# Patient Record
Sex: Male | Born: 1955 | Race: White | Hispanic: No | Smoking: Current every day smoker
Health system: Southern US, Community
[De-identification: ages and names within clinical notes are randomized; demographics above are authoritative.]

## PROBLEM LIST (undated history)

## (undated) DIAGNOSIS — M1990 Unspecified osteoarthritis, unspecified site: Secondary | ICD-10-CM

## (undated) DIAGNOSIS — E039 Hypothyroidism, unspecified: Secondary | ICD-10-CM

## (undated) DIAGNOSIS — E079 Disorder of thyroid, unspecified: Secondary | ICD-10-CM

## (undated) DIAGNOSIS — N529 Male erectile dysfunction, unspecified: Secondary | ICD-10-CM

## (undated) HISTORY — DX: Disorder of thyroid, unspecified: E07.9

## (undated) HISTORY — PX: BACK SURGERY: SHX140

## (undated) HISTORY — DX: Male erectile dysfunction, unspecified: N52.9

## (undated) HISTORY — DX: Unspecified osteoarthritis, unspecified site: M19.90

---

## 2004-03-20 ENCOUNTER — Other Ambulatory Visit: Payer: Self-pay

## 2004-03-20 ENCOUNTER — Emergency Department: Payer: Self-pay | Admitting: Emergency Medicine

## 2004-09-24 ENCOUNTER — Ambulatory Visit: Payer: Self-pay | Admitting: Podiatry

## 2005-01-22 ENCOUNTER — Emergency Department: Payer: Self-pay | Admitting: Emergency Medicine

## 2006-06-06 HISTORY — PX: FOOT FRACTURE SURGERY: SHX645

## 2006-07-11 ENCOUNTER — Ambulatory Visit: Payer: Self-pay

## 2006-08-17 ENCOUNTER — Ambulatory Visit (HOSPITAL_COMMUNITY): Admission: RE | Admit: 2006-08-17 | Discharge: 2006-08-18 | Payer: Self-pay | Admitting: Neurosurgery

## 2006-09-06 ENCOUNTER — Encounter: Admission: RE | Admit: 2006-09-06 | Discharge: 2006-09-06 | Payer: Self-pay | Admitting: Neurosurgery

## 2009-06-03 ENCOUNTER — Encounter: Admission: RE | Admit: 2009-06-03 | Discharge: 2009-06-03 | Payer: Self-pay | Admitting: Neurosurgery

## 2010-10-22 NOTE — Op Note (Signed)
[name], [name]                ACCOUNT NO.:  [id number]   MEDICAL RECORD NO.:  [id number]          PATIENT TYPE:  OIB   LOCATION:  3012                         FACILITY:  [facility]   PHYSICIAN:  Reinaldo Meeker, M.D. DATE OF BIRTH:  Nov 10, 1955   DATE OF PROCEDURE:  08/17/2006  DATE OF DISCHARGE:                               OPERATIVE REPORT   PREOPERATIVE DIAGNOSIS:  Herniated disc C5-6, C6-7 with spinal cord  compression.   POSTOPERATIVE DIAGNOSIS:  Herniated disc C5-6, C6-7 with spinal cord  compression.   PROCEDURE:  C5-6, C6-7 anterior cervical diskectomy with bone bank  fusion followed by Accu-Fix anterior cervical plating.   SURGEON:  Reinaldo Meeker, M.D.   ASSESSMENT:  Dr. Marikay Alar.   PROCEDURE IN DETAIL:  After being placed in the supine position in 10  pounds halter traction, the patient's neck was prepped and draped in the  usual sterile fashion.  Localizing fluoroscopy was used prior to  incision to identify the appropriate level.  Transverse incision was  made in the right anterior neck starting at the midline, heading towards  the medial aspect of the sternocleidomastoid muscle.  The platysma  muscle was then incised transversely.  The natural fascial plane between  the strap muscles medially and the sternocleidomastoid laterally was  identified and followed down to the anterior aspect of the cervical  spine.  The longus coli muscles were identified, slit in the midline,  stripped away bilaterally with resector and key elevator.  Self-  retaining retractor was placed for exposure and x-ray showed we  approached the appropriate level.  Using the #15 blade, the disc at C5-6  and C6-7 was incised.  Using pituitary rongeurs and curets approximately  90% of the disc material was removed at both levels.  High speed drill  was used to widen the interspace at both levels.  The microscope was  draped, brought into field.  Using microdissection technique, starting  at C5-6, the remainder of the disc material down to the posterior  longitudinal ligament was removed.  The ligament was then incised  transversely and the cut edge removed with the Kerrison punch.  Thorough  spinal decompression was carried out by removing the herniated disc  material and proximal foraminal decompression was carried out  bilaterally until the proximal nerve roots were easily visualized.  Inspection was carried out for any evidence of residual compression at  this level, none could be identified so attention was then turned to C6-  7.  Once again, the remainder of the disc material down the posterior  longitudinal ligament was removed.  Once again, the ligament was incised  transversely and the cut edges removed.  Once again, thorough  decompression was carried out at the spinal dura and she had a foraminal  decompression particularly on the left the more symptomatic side.   At this time, inspection was carried out at both levels without any  evidence of residual compression identified.  Large amounts of  irrigation were carried out.  Bleeding was controlled with bipolar  coagulation and gel foam.  Measurements were taken  and two 8 mm bone  bank plugs were reconstituted.  After irrigating once more and  confirming hemostasis, the bone was packed without difficulty and  fluoroscopy showed them to be in good position.  An appropriate length  Accu-Fix anterior cervical plate was then chosen.  Under fluoroscopic  guidance, drill holes were placed followed by placing 13 mm screws x 6.  Locking mechanism was secured bilaterally at all 3 levels.  Final  fluoroscopy showed the plates, screws and plugs all to be in good  position.  Bleeding was controlled with  bipolar coagulation.  This was then closed using interrupted Vicryl on  the platysmas, inverted 5-0 PDS on the subcuticular layer and Steri-  strips on the skin.  Dry sterile dressing and soft towel were applied  and the  patient was extubated and taken to the recovery room in stable  condition.           ______________________________  Reinaldo Meeker, M.D.     ROK/MEDQ  D:  08/17/2006  T:  08/17/2006  Job:  [id number]

## 2013-06-06 HISTORY — PX: COLONOSCOPY: SHX174

## 2013-10-31 ENCOUNTER — Ambulatory Visit: Payer: Self-pay

## 2014-02-07 ENCOUNTER — Ambulatory Visit: Payer: Self-pay | Admitting: Gastroenterology

## 2014-02-11 LAB — PATHOLOGY REPORT

## 2015-06-30 ENCOUNTER — Ambulatory Visit (INDEPENDENT_AMBULATORY_CARE_PROVIDER_SITE_OTHER): Payer: Managed Care, Other (non HMO) | Admitting: General Surgery

## 2015-06-30 ENCOUNTER — Encounter: Payer: Self-pay | Admitting: General Surgery

## 2015-06-30 VITALS — BP 114/70 | HR 76 | Resp 14 | Ht 72.0 in | Wt 249.0 lb

## 2015-06-30 DIAGNOSIS — K645 Perianal venous thrombosis: Secondary | ICD-10-CM | POA: Diagnosis not present

## 2015-06-30 HISTORY — PX: HEMORRHOID SURGERY: SHX153

## 2015-06-30 MED ORDER — HYDROCORTISONE ACE-PRAMOXINE 2.5-1 % RE CREA: 1.0000 "application " | TOPICAL_CREAM | Freq: Three times a day (TID) | RECTAL | Status: DC

## 2015-06-30 MED ORDER — HYDROCORTISONE ACE-PRAMOXINE 2.5-1 % RE CREA: 1.0000 "application " | TOPICAL_CREAM | Freq: Two times a day (BID) | RECTAL | Status: DC

## 2015-06-30 NOTE — Progress Notes (Signed)
Patient ID: [name], male   DOB: May 11, 1956, 60 y.o.   MRN: [id number]  Chief Complaint  Patient presents with  . Rectal Problems    HPI [name] is a 60 y.o. male.  Here today for evaluation of possible external hemorrhoids. He states it feels like a "marble" at his rectum. He noticed it about one week ago.  Denies bleeding. Discomfort sitting but no pain. Bowels move daily and are regular. He has had a hemorrhoid back in the 1980's but no surgery was needed. I have reviewed the history of present illness with the patient.  HPI  Past Medical History  Diagnosis Date  . Arthritis   . Thyroid disease     Past Surgical History  Procedure Laterality Date  . Back surgery  1995, 2008  . Foot fracture surgery Right 2008  . Colonoscopy  2015    History reviewed. No pertinent family history.  Social History Social History  Substance Use Topics  . Smoking status: Current Every Day Smoker -- 0.75 packs/day for 25 years    Types: Cigarettes  . Smokeless tobacco: Never Used  . Alcohol Use: 0.0 oz/week    0 Standard drinks or equivalent per week     Comment: occasionally    Allergies  Allergen Reactions  . Daypro [Oxaprozin] Itching    Current Outpatient Prescriptions  Medication Sig Dispense Refill  . levothyroxine (SYNTHROID, LEVOTHROID) 150 MCG tablet TAKE ONE TABLET BY MOUTH ONCE DAILY...TAKE ON AN EMPTY STOMACH WITH A GLASS OF WATER AT LEAST 30-60 MINUTES BEFORE BREAKFAST    . meloxicam (MOBIC) 7.5 MG tablet Take by mouth.    . Multiple Vitamin (MULTI-VITAMINS) TABS Take by mouth.    . hydrocortisone-pramoxine (ANALPRAM HC) 2.5-1 % rectal cream Place 1 application rectally 2 (two) times daily. 30 g 0   No current facility-administered medications for this visit.    Review of Systems Review of Systems  Constitutional: Negative.   Respiratory: Negative.   Cardiovascular: Negative.     Blood pressure 114/70, pulse 76, resp. rate 14, height 6' (1.829 m),  weight 249 lb (112.946 kg).  Physical Exam Physical Exam  Constitutional: He is oriented to person, place, and time. He appears well-developed and well-nourished.  HENT:  Mouth/Throat: Oropharynx is clear and moist.  Eyes: Conjunctivae are normal. No scleral icterus.  Neck: Neck supple.  Cardiovascular: Normal rate, regular rhythm and normal heart sounds.   Pulmonary/Chest: Effort normal and breath sounds normal.  Abdominal: Soft. There is no tenderness. Hernia confirmed negative in the right inguinal area and confirmed negative in the left inguinal area.  Genitourinary: Rectal exam shows external hemorrhoid.  3-4cm mildly tender, thrombosed external hemorrhoid at 8 o'clock   Lymphadenopathy:    He has no cervical adenopathy.  Neurological: He is alert and oriented to person, place, and time.  Skin: Skin is warm and dry.  Psychiatric: His behavior is normal.    Data Reviewed   Assessment    Thrombosed external hemorrhoid    Plan   With consent the hemorrhoid was lanced and clots removed.  Area was prepped with betadine. 3ml of 1%xylocaine mixed with 0.5%  Instilled. Radial incision made and large amount of clots enucleated. Swelling significantly decreased after. Procedure well tolerated.        PCP/Ref:  Virk, Charanjit  This information has been scribed by Dorathy Daft RNBC.   Hansika Leaming G 06/30/2015, 6:18 PM

## 2015-06-30 NOTE — Patient Instructions (Addendum)
The patient is aware to call back for any questions or concerns.   Patient to return in 3 weeks.

## 2015-07-01 ENCOUNTER — Telehealth: Payer: Self-pay | Admitting: *Deleted

## 2015-07-01 ENCOUNTER — Encounter: Payer: Self-pay | Admitting: General Surgery

## 2015-07-01 NOTE — Telephone Encounter (Signed)
Insurance denied the RX for Analpram. Dr Evette Cristal wants the patient to make use of Anusol HC cream BID (over the counter).

## 2015-07-02 NOTE — Telephone Encounter (Signed)
He did get the Analpram cream for $68. Aware of the OTC product. The patient is aware to call back for any questions or concerns.

## 2015-07-22 ENCOUNTER — Encounter: Payer: Self-pay | Admitting: General Surgery

## 2015-07-22 ENCOUNTER — Ambulatory Visit (INDEPENDENT_AMBULATORY_CARE_PROVIDER_SITE_OTHER): Payer: Managed Care, Other (non HMO) | Admitting: General Surgery

## 2015-07-22 ENCOUNTER — Ambulatory Visit: Payer: Managed Care, Other (non HMO) | Admitting: General Surgery

## 2015-07-22 VITALS — BP 158/80 | HR 70 | Resp 14 | Ht 72.0 in | Wt 245.0 lb

## 2015-07-22 DIAGNOSIS — K645 Perianal venous thrombosis: Secondary | ICD-10-CM

## 2015-07-22 NOTE — Patient Instructions (Signed)
The patient is aware to call back for any questions or concerns.  

## 2015-07-22 NOTE — Progress Notes (Signed)
Here today for follow up incision thrombosed hemorrhoid. He states the area is much better, no pain and no bleeding. Bowels moving regular. I have reviewed the history of present illness with the patient.  The incised hemorrhoid has fully resolved. No new findings.   Follow up as needed. The patient is aware to call back for any questions or concerns.    PCP:  Sula Rumple This information has been scribed by Dorathy Daft RNBC.

## 2016-05-20 ENCOUNTER — Ambulatory Visit (INDEPENDENT_AMBULATORY_CARE_PROVIDER_SITE_OTHER): Payer: 59 | Admitting: Urology

## 2016-05-20 ENCOUNTER — Encounter: Payer: Self-pay | Admitting: Urology

## 2016-05-20 VITALS — BP 142/72 | HR 66 | Ht 72.0 in | Wt 260.0 lb

## 2016-05-20 DIAGNOSIS — N138 Other obstructive and reflux uropathy: Secondary | ICD-10-CM | POA: Diagnosis not present

## 2016-05-20 DIAGNOSIS — N401 Enlarged prostate with lower urinary tract symptoms: Secondary | ICD-10-CM

## 2016-05-20 DIAGNOSIS — N529 Male erectile dysfunction, unspecified: Secondary | ICD-10-CM

## 2016-05-20 MED ORDER — SILDENAFIL CITRATE 20 MG PO TABS: ORAL_TABLET | ORAL | 3 refills | Status: DC

## 2016-05-20 NOTE — Progress Notes (Signed)
05/20/2016 10:33 AM   [name] 1955/10/23 [id number]  Referring provider: Sula Rumpleharanjit Virk, MD [address] [place], [place] [address]  Chief Complaint  Patient presents with  . New Patient (Initial Visit)    ED  referred by Dr. Elmer RampVirk    HPI: Patient is a 60 year old occasion male who was referred by Dr. Elmer RampVIrk for erectile dysfunction.  Erectile dysfunction His SHIM score is 17, which is mild ED.   He has been having difficulty with erections for the last two months.   His major complaint is maintaining an erection.  His libido is preserved.   His risk factors for ED are age, BPH, hypogonadism, DM, HTN, hypothyroidism, stress, night shift work, alcohol abuse and smoking, .  He is not longer having any spontaneous erections.  He denies any painful erections or curvatures with his erections.   He has tried Levitra 10 mg in the past and he did not achieve a very firm erection.         SHIM    Row Name 05/20/16 [id number]         SHIM: Over the last 6 months:   How do you rate your confidence that you could get and keep an erection? Low     When you had erections with sexual stimulation, how often were your erections hard enough for penetration (entering your partner)? Sometimes (about half the time)     During sexual intercourse, how often were you able to maintain your erection after you had penetrated (entered) your partner? Slightly Difficult     During sexual intercourse, how difficult was it to maintain your erection to completion of intercourse? Slightly Difficult     When you attempted sexual intercourse, how often was it satisfactory for you? Slightly Difficult       SHIM Total Score   SHIM 17        Score: 1-7 Severe ED 8-11 Moderate ED 12-16 Mild-Moderate ED 17-21 Mild ED 22-25 No ED    BPH WITH LUTS His IPSS score today is 1, which is mild lower urinary tract symptomatology. He is pleased with his quality life due to his urinary symptoms.   His major  complaint today is nocturia x 1.  He has had these symptoms for several years.  He denies any dysuria, hematuria or suprapubic pain.  He also denies any recent fevers, chills, nausea or vomiting.  He does not have a family history of PCa.     IPSS    Row Name 05/20/16 0900         International Prostate Symptom Score   How often have you had the sensation of not emptying your bladder? Not at All     How often have you had to urinate less than every two hours? Not at All     How often have you found you stopped and started again several times when you urinated? Not at All     How often have you found it difficult to postpone urination? Not at All     How often have you had a weak urinary stream? Not at All     How often have you had to strain to start urination? Not at All     How many times did you typically get up at night to urinate? 1 Time     Total IPSS Score 1       Quality of Life due to urinary symptoms   If you  were to spend the rest of your life with your urinary condition just the way it is now how would you feel about that? Pleased        Score:  1-7 Mild 8-19 Moderate 20-35 Severe    PMH: Past Medical History:  Diagnosis Date  . Arthritis   . ED (erectile dysfunction)   . Thyroid disease     Surgical History: Past Surgical History:  Procedure Laterality Date  . BACK SURGERY  1995, 2008  . COLONOSCOPY  2015  . FOOT FRACTURE SURGERY Right 2008  . HEMORRHOID SURGERY  06-30-15   incision thrombosed hemorrhoid Dr Evette Cristal    Home Medications:  Allergies as of 05/20/2016      Reactions   Daypro [oxaprozin] Itching      Medication List       Accurate as of 05/20/16 10:33 AM. Always use your most recent med list.          hydrocortisone-pramoxine 2.5-1 % rectal cream Commonly known as:  ANALPRAM HC Place 1 application rectally 2 (two) times daily.   levothyroxine 150 MCG tablet Commonly known as:  SYNTHROID, LEVOTHROID TAKE ONE TABLET BY MOUTH ONCE  DAILY...TAKE ON AN EMPTY STOMACH WITH A GLASS OF WATER AT LEAST 30-60 MINUTES BEFORE BREAKFAST   meloxicam 7.5 MG tablet Commonly known as:  MOBIC Take by mouth.   MULTI-VITAMINS Tabs Take by mouth.   sildenafil 20 MG tablet Commonly known as:  REVATIO Take 5 tablets two hours prior to intercourse on empty stomach   vardenafil 10 MG tablet Commonly known as:  LEVITRA Take 10 mg by mouth daily as needed for erectile dysfunction.       Allergies:  Allergies  Allergen Reactions  . Daypro [Oxaprozin] Itching    Family History: Family History  Problem Relation Age of Onset  . Prostate cancer Neg Hx   . Kidney cancer Neg Hx   . Bladder Cancer Neg Hx     Social History:  reports that he has been smoking Cigarettes.  He has a 18.75 pack-year smoking history. He has never used smokeless tobacco. He reports that he drinks alcohol. He reports that he does not use drugs.  ROS: UROLOGY Frequent Urination?: No Hard to postpone urination?: No Burning/pain with urination?: No Get up at night to urinate?: No Leakage of urine?: No Urine stream starts and stops?: No Trouble starting stream?: No Do you have to strain to urinate?: No Blood in urine?: No Urinary tract infection?: No Sexually transmitted disease?: No Injury to kidneys or bladder?: No Painful intercourse?: No Weak stream?: No Erection problems?: Yes Penile pain?: No  Gastrointestinal Nausea?: No Vomiting?: No Indigestion/heartburn?: No Diarrhea?: No Constipation?: No  Constitutional Fever: No Night sweats?: No Weight loss?: No Fatigue?: No  Skin Skin rash/lesions?: No Itching?: No  Eyes Blurred vision?: No Double vision?: No  Ears/Nose/Throat Sore throat?: No Sinus problems?: No  Hematologic/Lymphatic Swollen glands?: No Easy bruising?: No  Cardiovascular Leg swelling?: No Chest pain?: No  Respiratory Cough?: No Shortness of breath?: No  Endocrine Excessive thirst?:  No  Musculoskeletal Back pain?: No Joint pain?: No  Neurological Headaches?: No Dizziness?: No  Psychologic Depression?: No Anxiety?: No  Physical Exam: BP (!) 142/72   Pulse 66   Ht 6' (1.829 m)   Wt 260 lb (117.9 kg)   BMI 35.26 kg/m   Constitutional: Well nourished. Alert and oriented, No acute distress. HEENT: [place] AT, moist mucus membranes. Trachea midline, no masses. Cardiovascular: No clubbing, cyanosis,  or edema. Respiratory: Normal respiratory effort, no increased work of breathing. GI: Abdomen is soft, non tender, non distended, no abdominal masses. Liver and spleen not palpable.  No hernias appreciated.  Stool sample for occult testing is not indicated.   GU: No CVA tenderness.  No bladder fullness or masses.  Patient with uncircumcised phallus.  Foreskin easily retracted  Urethral meatus is patent.  No penile discharge. No penile lesions or rashes. Scrotum without lesions, cysts, rashes and/or edema.  Testicles are located scrotally bilaterally. No masses are appreciated in the testicles. Left and right epididymis are normal. Rectal: Patient with  normal sphincter tone. Anus and perineum without scarring or rashes. No rectal masses are appreciated. Prostate is approximately 50 grams, no nodules are appreciated. Seminal vesicles are normal. Skin: No rashes, bruises or suspicious lesions. Lymph: No cervical or inguinal adenopathy. Neurologic: Grossly intact, no focal deficits, moving all 4 extremities. Psychiatric: Normal mood and affect.  Laboratory Data: PSA History  0.83 ng/mL on 12/12/2014  1.02 ng/mL on 04/13/2016  Assessment & Plan:    1. Erectile dysfunction  - SHIM score is 17  - I explained to the patient that in order to achieve an erection it takes good functioning of the nervous system (parasympathetic, sympathetic, sensory and motor), good blood flow into the erectile tissue of the penis and a desire to have sex  - I explained that conditions like  diabetes, hypertension, coronary artery disease, peripheral vascular disease, smoking, alcohol consumption, age, sleep apnea and BPH can diminish the ability to have an erection  - We discussed trying a  different PDE5 inhibitor, intra-urethral suppositories, intracavernous vasoactive drug injection therapy, vacuum constriction device and penile prosthesis implantation  - He would like to try sildenafil 20 mg, take 5 tablets two hours prior to intercourse on an empty stomach  - RTC in one month for repeat SHIM score and exam   2. BPH with LUTS  - IPSS score is 1/1  - Continue conservative management, avoiding bladder irritants and timed voiding's  - RTC in 12 months for IPSS, PSA and exam   Return in about 1 month (around 06/20/2016) for SHIM score.  These notes generated with voice recognition software. I apologize for typographical errors.  Michiel CowboySHANNON Serine Kea, PA-C  [facility] [address], [address] [place], [place] [address] [phone]

## 2016-06-24 ENCOUNTER — Ambulatory Visit: Payer: 59 | Admitting: Urology

## 2016-07-15 ENCOUNTER — Encounter: Payer: Self-pay | Admitting: Urology

## 2016-07-15 ENCOUNTER — Ambulatory Visit (INDEPENDENT_AMBULATORY_CARE_PROVIDER_SITE_OTHER): Payer: 59 | Admitting: Urology

## 2016-07-15 VITALS — BP 137/69 | HR 73 | Ht 72.0 in | Wt 264.0 lb

## 2016-07-15 DIAGNOSIS — N529 Male erectile dysfunction, unspecified: Secondary | ICD-10-CM | POA: Diagnosis not present

## 2016-07-15 NOTE — Progress Notes (Signed)
07/15/2016 12:02 PM   [name] 1956/01/19 [id number]  Referring provider: Sula Rumple, MD [address] [facility] [address] [place], [place] [address]  Chief Complaint  Patient presents with  . Erectile Dysfunction    1 month follow up    HPI: Patient is a 61 year old Caucasian male who presents today for a one month follow up for ED after a trial of sildenafil 20 mg.    Erectile dysfunction Patient was referred by Dr. Elmer Ramp for ED.  His SHIM score is 16, which is mild to moderate ED.   His previous SHIM score was 17.  He has been having difficulty with erections for the last two months.   His major complaint is maintaining an erection.  His libido is preserved.   His risk factors for ED are age, BPH, hypogonadism, DM, HTN, hypothyroidism, stress, night shift work, alcohol abuse and smoking, .  He is not longer having any spontaneous erections.  He denies any painful erections or curvatures with his erections.   He has tried Levitra 10 mg in the past and he did not achieve a very firm erection.   More recently, he has tried sildenafil 20 mg.  He states he had satisfactory erections with the medication, but he would like to have erections naturally.      SHIM    Row Name 05/20/16 [phone] 07/15/16 0859       SHIM: Over the last 6 months:   How do you rate your confidence that you could get and keep an erection? Low Low    When you had erections with sexual stimulation, how often were your erections hard enough for penetration (entering your partner)? Sometimes (about half the time) Sometimes (about half the time)    During sexual intercourse, how often were you able to maintain your erection after you had penetrated (entered) your partner? Slightly Difficult Difficult    During sexual intercourse, how difficult was it to maintain your erection to completion of intercourse? Slightly Difficult Slightly Difficult    When you attempted sexual intercourse, how often was it satisfactory for you?  Slightly Difficult Slightly Difficult      SHIM Total Score   SHIM 17 16       Score: 1-7 Severe ED 8-11 Moderate ED 12-16 Mild-Moderate ED 17-21 Mild ED 22-25 No ED  PMH: Past Medical History:  Diagnosis Date  . Arthritis   . ED (erectile dysfunction)   . Thyroid disease     Surgical History: Past Surgical History:  Procedure Laterality Date  . BACK SURGERY  1995, 2008  . COLONOSCOPY  2015  . FOOT FRACTURE SURGERY Right 2008  . HEMORRHOID SURGERY  06-30-15   incision thrombosed hemorrhoid Dr Evette Cristal    Home Medications:  Allergies as of 07/15/2016      Reactions   Daypro [oxaprozin] Itching      Medication List       Accurate as of 07/15/16 12:02 PM. Always use your most recent med list.          hydrocortisone-pramoxine 2.5-1 % rectal cream Commonly known as:  ANALPRAM HC Place 1 application rectally 2 (two) times daily.   levothyroxine 150 MCG tablet Commonly known as:  SYNTHROID, LEVOTHROID TAKE ONE TABLET BY MOUTH ONCE DAILY...TAKE ON AN EMPTY STOMACH WITH A GLASS OF WATER AT LEAST 30-60 MINUTES BEFORE BREAKFAST   meloxicam 7.5 MG tablet Commonly known as:  MOBIC Take by mouth.   MULTI-VITAMINS Tabs Take by mouth.  sildenafil 20 MG tablet Commonly known as:  REVATIO Take 5 tablets two hours prior to intercourse on empty stomach   vardenafil 10 MG tablet Commonly known as:  LEVITRA Take 10 mg by mouth daily as needed for erectile dysfunction.       Allergies:  Allergies  Allergen Reactions  . Daypro [Oxaprozin] Itching    Family History: Family History  Problem Relation Age of Onset  . Prostate cancer Neg Hx   . Kidney cancer Neg Hx   . Bladder Cancer Neg Hx     Social History:  reports that he has been smoking Cigarettes.  He has a 18.75 pack-year smoking history. He has never used smokeless tobacco. He reports that he drinks alcohol. He reports that he does not use drugs.  ROS: UROLOGY Frequent Urination?: No Hard to postpone  urination?: Yes Burning/pain with urination?: No Get up at night to urinate?: No Leakage of urine?: No Urine stream starts and stops?: No Trouble starting stream?: No Do you have to strain to urinate?: No Blood in urine?: No Urinary tract infection?: No Sexually transmitted disease?: No Injury to kidneys or bladder?: No Painful intercourse?: No Weak stream?: No Erection problems?: Yes Penile pain?: No  Gastrointestinal Nausea?: No Vomiting?: No Indigestion/heartburn?: No Diarrhea?: No Constipation?: No  Constitutional Fever: No Night sweats?: No Weight loss?: No Fatigue?: No  Skin Skin rash/lesions?: No Itching?: No  Eyes Blurred vision?: No Double vision?: No  Ears/Nose/Throat Sore throat?: No Sinus problems?: No  Hematologic/Lymphatic Swollen glands?: No Easy bruising?: No  Cardiovascular Leg swelling?: No Chest pain?: No  Respiratory Cough?: No Shortness of breath?: No  Endocrine Excessive thirst?: No  Musculoskeletal Back pain?: No Joint pain?: Yes  Neurological Headaches?: No Dizziness?: No  Psychologic Depression?: No Anxiety?: No  Physical Exam: BP 137/69   Pulse 73   Ht 6' (1.829 m)   Wt 264 lb (119.7 kg)   BMI 35.80 kg/m   Constitutional: Well nourished. Alert and oriented, No acute distress. HEENT:  AT, moist mucus membranes. Trachea midline, no masses. Cardiovascular: No clubbing, cyanosis, or edema. Respiratory: Normal respiratory effort, no increased work of breathing. Skin: No rashes, bruises or suspicious lesions. Lymph: No cervical or inguinal adenopathy. Neurologic: Grossly intact, no focal deficits, moving all 4 extremities. Psychiatric: Normal mood and affect.  Laboratory Data: PSA History  0.83 ng/mL on 12/12/2014  1.02 ng/mL on 04/13/2016  Assessment & Plan:    1. Erectile dysfunction  - SHIM score is 16  - patient is very upset that he cannot have erections naturally, I have a very frank discussion  with the patient regarding how his age, BPH, weight, smoking and prediabetes status are contributing to his ED.  I encouraged the patient to stop smoking, lose weight, engage in healthy eating and exercises to help with his ED.  I did express that I did not feel that he would be able to achieve satisfactory erections at this stage without the use of   - He was very upset that I did not check his urine at today's visit as he felt it could give a reason as to why he is having erectile dysfunction, when I queried him further, he stated he read on the Internet that cancer can cause ED.  I reviewed his lab work and his exam findings and stated that there was no indication to obtain an urine or any signs of cancer  - He was also upset at my suggestions to stop smoking and to  lose weight as he stated he had been smoking for a long time and he actually weighs less now than he did when he was younger and he did not have trouble having erections, at this time I offered the patient an appointment with another provider as he was not satisfied with my care - he stated he would make his own appointment with another urologist   2. BPH with LUTS  - Not addressed at this visit    Return for patient will make his own appointment with another urologist.  These notes generated with voice recognition software. I apologize for typographical errors.  Michiel Cowboy, PA-C  [facility] [address] [place], [place] [address] [phone]

## 2017-12-27 ENCOUNTER — Encounter

## 2017-12-27 ENCOUNTER — Ambulatory Visit: Payer: 59 | Admitting: Urology

## 2017-12-27 ENCOUNTER — Encounter: Payer: Self-pay | Admitting: Urology

## 2017-12-27 VITALS — BP 113/68 | HR 59 | Ht 72.0 in | Wt 246.0 lb

## 2017-12-27 DIAGNOSIS — N529 Male erectile dysfunction, unspecified: Secondary | ICD-10-CM

## 2017-12-27 DIAGNOSIS — N401 Enlarged prostate with lower urinary tract symptoms: Secondary | ICD-10-CM | POA: Diagnosis not present

## 2017-12-27 LAB — URINALYSIS, COMPLETE
BILIRUBIN UA: NEGATIVE
GLUCOSE, UA: NEGATIVE
KETONES UA: NEGATIVE
LEUKOCYTES UA: NEGATIVE
Nitrite, UA: NEGATIVE
PROTEIN UA: NEGATIVE
RBC, UA: NEGATIVE
SPEC GRAV UA: 1.015 (ref 1.005–1.030)
Urobilinogen, Ur: 0.2 mg/dL (ref 0.2–1.0)
pH, UA: 6 (ref 5.0–7.5)

## 2017-12-27 MED ORDER — SILDENAFIL CITRATE 20 MG PO TABS: ORAL_TABLET | ORAL | 1 refills | Status: DC

## 2017-12-27 NOTE — Progress Notes (Signed)
12/27/2017 10:34 AM   [name] 25-Mar-1956 [id number]  Referring provider: Sula Rumple, MD No address on file  Chief Complaint  Patient presents with  . Erectile Dysfunction    HPI: 62 year old male most recently followed by Dr. Achilles Dunk for erectile dysfunction and BPH.  He saw Carollee Herter in our office in 2018 and elected to be seen at [facility].  He last saw Dr. Achilles Dunk in June 2018.  He remains on tamsulosin with stable lower urinary tract symptoms.  He does note urinary frequency and urgency.  Denies dysuria or gross hematuria.  On chart review his last PSA was in 2017 and was 1.02.  He is currently using generic sildenafil 20 mg and taking 100 mg for ED.  He has taken Cialis in the past which he felt was more effective however is cost prohibitive.   PMH: Past Medical History:  Diagnosis Date  . Arthritis   . ED (erectile dysfunction)   . Thyroid disease     Surgical History: Past Surgical History:  Procedure Laterality Date  . BACK SURGERY  1995, 2008  . COLONOSCOPY  2015  . FOOT FRACTURE SURGERY Right 2008  . HEMORRHOID SURGERY  06-30-15   incision thrombosed hemorrhoid Dr Evette Cristal    Home Medications:  Allergies as of 12/27/2017      Reactions   Daypro [oxaprozin] Itching      Medication List        Accurate as of 12/27/17 10:34 AM. Always use your most recent med list.          ibuprofen 200 MG tablet Commonly known as:  ADVIL,MOTRIN Take by mouth.   ketoconazole 2 % cream Commonly known as:  NIZORAL Apply topically.   levothyroxine 175 MCG tablet Commonly known as:  SYNTHROID, LEVOTHROID 150 mcg.   MULTI-VITAMINS Tabs Take by mouth.   sildenafil 20 MG tablet Commonly known as:  REVATIO Take 5 tablets two hours prior to intercourse on empty stomach   tamsulosin 0.4 MG Caps capsule Commonly known as:  FLOMAX Take 0.4 mg by mouth daily.   vardenafil 10 MG tablet Commonly known as:  LEVITRA Take 10 mg by mouth daily as needed for erectile  dysfunction.       Allergies:  Allergies  Allergen Reactions  . Daypro [Oxaprozin] Itching    Family History: Family History  Problem Relation Age of Onset  . Prostate cancer Neg Hx   . Kidney cancer Neg Hx   . Bladder Cancer Neg Hx     Social History:  reports that he has been smoking cigarettes.  He has a 18.75 pack-year smoking history. He has never used smokeless tobacco. He reports that he drinks alcohol. He reports that he does not use drugs.  ROS: UROLOGY Frequent Urination?: Yes Hard to postpone urination?: Yes Burning/pain with urination?: No Get up at night to urinate?: Yes Leakage of urine?: No Urine stream starts and stops?: No Trouble starting stream?: No Do you have to strain to urinate?: No Blood in urine?: No Urinary tract infection?: No Sexually transmitted disease?: No Injury to kidneys or bladder?: No Painful intercourse?: No Weak stream?: No Erection problems?: Yes Penile pain?: No  Gastrointestinal Nausea?: No Vomiting?: No Indigestion/heartburn?: No Diarrhea?: No Constipation?: No  Constitutional Fever: No Night sweats?: No Weight loss?: No Fatigue?: No  Skin Skin rash/lesions?: No Itching?: No  Eyes Blurred vision?: No Double vision?: No  Ears/Nose/Throat Sore throat?: No Sinus problems?: No  Hematologic/Lymphatic Swollen glands?: No Easy bruising?: No  Cardiovascular Leg swelling?: No Chest pain?: No  Respiratory Cough?: No Shortness of breath?: No  Endocrine Excessive thirst?: No  Musculoskeletal Back pain?: No Joint pain?: Yes  Neurological Headaches?: No Dizziness?: No  Psychologic Depression?: No Anxiety?: No  Physical Exam: BP 113/68 (BP Location: Left Arm, Patient Position: Sitting, Cuff Size: Large)   Pulse (!) 59   Ht 6' (1.829 m)   Wt 246 lb (111.6 kg)   BMI 33.36 kg/m   Constitutional:  Alert and oriented, No acute distress. HEENT: [place] AT, moist mucus membranes.  Trachea midline, no  masses. Cardiovascular: No clubbing, cyanosis, or edema. Respiratory: Normal respiratory effort, no increased work of breathing. GI: Abdomen is soft, nontender, nondistended, no abdominal masses GU: No CVA tenderness.  Prostate 40 g, smooth without nodules. Lymph: No cervical or inguinal lymphadenopathy. Skin: No rashes, bruises or suspicious lesions. Neurologic: Grossly intact, no focal deficits, moving all 4 extremities. Psychiatric: Normal mood and affect.   Assessment & Plan:   1.  BPH with lower urinary tract symptoms  He inquired about the etiology of his lower urinary tract symptoms and was informed the most common cause is prostate enlargement.  His DRE is benign.  A PSA was ordered and he will be notified with the results.  Continue tamsulosin.  2.  Erectile dysfunction  He also inquired about the etiology of his erectile dysfunction.  His most significant risk factor is chronic tobacco use and was informed this is a common etiology of arterial insufficiency causing ED.  He was informed the best test to document this would be a penile Doppler study however he desired not to pursue.   Riki AltesScott C Michaeljames Milnes, MD  [facility] [address], [address] [place], [place] [address] [phone]

## 2017-12-28 LAB — PSA: PROSTATE SPECIFIC AG, SERUM: 0.7 ng/mL (ref 0.0–4.0)

## 2018-01-01 ENCOUNTER — Telehealth: Payer: Self-pay

## 2018-01-01 NOTE — Telephone Encounter (Signed)
-----   Message from Riki AltesScott C Stoioff, MD sent at 12/31/2017  8:57 AM EDT ----- PSA normal at 0.7

## 2018-01-01 NOTE — Telephone Encounter (Signed)
Tried to call pt with results, line ringing busy.

## 2018-01-02 NOTE — Telephone Encounter (Signed)
Tried calling pt, line ringing busy.

## 2018-01-03 NOTE — Telephone Encounter (Signed)
Tried calling pt, no answer, line busy.

## 2018-05-29 ENCOUNTER — Other Ambulatory Visit: Payer: Self-pay | Admitting: Unknown Physician Specialty

## 2018-05-29 DIAGNOSIS — M25361 Other instability, right knee: Secondary | ICD-10-CM

## 2018-05-29 DIAGNOSIS — G8929 Other chronic pain: Secondary | ICD-10-CM

## 2018-05-29 DIAGNOSIS — M25561 Pain in right knee: Secondary | ICD-10-CM

## 2018-06-04 ENCOUNTER — Other Ambulatory Visit: Payer: Self-pay | Admitting: Unknown Physician Specialty

## 2018-06-04 DIAGNOSIS — M25561 Pain in right knee: Secondary | ICD-10-CM

## 2018-06-04 DIAGNOSIS — G8929 Other chronic pain: Secondary | ICD-10-CM

## 2018-06-04 DIAGNOSIS — M25361 Other instability, right knee: Secondary | ICD-10-CM

## 2018-06-08 ENCOUNTER — Ambulatory Visit: Admission: RE | Admit: 2018-06-08 | Payer: 59 | Source: Ambulatory Visit

## 2019-01-02 ENCOUNTER — Ambulatory Visit: Payer: 59 | Admitting: Urology

## 2019-02-05 ENCOUNTER — Encounter: Payer: Self-pay | Admitting: Urology

## 2019-02-05 ENCOUNTER — Other Ambulatory Visit: Payer: Self-pay

## 2019-02-05 ENCOUNTER — Ambulatory Visit: Payer: 59 | Admitting: Urology

## 2019-02-05 VITALS — BP 155/78 | HR 74 | Ht 72.0 in | Wt 260.0 lb

## 2019-02-05 DIAGNOSIS — N5201 Erectile dysfunction due to arterial insufficiency: Secondary | ICD-10-CM

## 2019-02-05 DIAGNOSIS — N401 Enlarged prostate with lower urinary tract symptoms: Secondary | ICD-10-CM

## 2019-02-05 MED ORDER — TADALAFIL 20 MG PO TABS: 20.0000 mg | ORAL_TABLET | Freq: Every day | ORAL | 3 refills | Status: DC | PRN

## 2019-02-05 NOTE — Progress Notes (Signed)
02/05/2019 12:48 PM   John FruitsRonnie D Bucio 03/08/1956 [id number]  Referring provider: [facility], [facility] [address] [place],  [place] [id number]  Chief Complaint  Patient presents with  . Erectile Dysfunction    Urologic history: 1.  BPH with lower urinary tract symptoms  -Tamsulosin 0.4 mg  2.  Erectile dysfunction  -Generic sildenafil 100 mg   HPI: 63 y.o. male presents for annual follow up.  Since his visit last year he has noted worsening storage related symptoms including urinary urgency, frequency and nocturia x2.  He voids with a good stream.  He remains on tamsulosin 0.4 mg daily.  He states his voiding symptoms are bothersome enough that he would desire further medical management.  Denies dysuria or gross hematuria.  He has no flank, abdominal, pelvic or scrotal pain.  IPSS completed today was 18/5.  He remains on tamsulosin 100 mg.  He is inquiring about other options. SHIM completed today was 12-16 indicating mild to moderate ED.  He has been on tadalafil in the past with good results.  PSA performed by his PCP June 2020 was stable at 0.73.   PMH: Past Medical History:  Diagnosis Date  . Arthritis   . ED (erectile dysfunction)   . Thyroid disease     Surgical History: Past Surgical History:  Procedure Laterality Date  . BACK SURGERY  1995, 2008  . COLONOSCOPY  2015  . FOOT FRACTURE SURGERY Right 2008  . HEMORRHOID SURGERY  06-30-15   incision thrombosed hemorrhoid Dr Evette CristalSankar    Home Medications:  Allergies as of 02/05/2019      Reactions   Daypro [oxaprozin] Itching      Medication List       Accurate as of February 05, 2019 12:48 PM. If you have any questions, ask your nurse or doctor.        STOP taking these medications   sildenafil 20 MG tablet Commonly known as: REVATIO Stopped by: Riki AltesScott C Moriya Mitchell, MD   vardenafil 10 MG tablet Commonly known as: LEVITRA Stopped by: Riki AltesScott C Syna Gad, MD     TAKE these medications    ibuprofen 200 MG tablet Commonly known as: ADVIL Take by mouth.   ketoconazole 2 % cream Commonly known as: NIZORAL Apply topically.   levothyroxine 175 MCG tablet Commonly known as: SYNTHROID 150 mcg.   Multi-Vitamins Tabs Take by mouth.   tadalafil 20 MG tablet Commonly known as: CIALIS Take 1 tablet (20 mg total) by mouth daily as needed for erectile dysfunction. Started by: Riki AltesScott C Revis Whalin, MD   tamsulosin 0.4 MG Caps capsule Commonly known as: FLOMAX Take 0.4 mg by mouth daily.       Allergies:  Allergies  Allergen Reactions  . Daypro [Oxaprozin] Itching    Family History: Family History  Problem Relation Age of Onset  . Prostate cancer Neg Hx   . Kidney cancer Neg Hx   . Bladder Cancer Neg Hx     Social History:  reports that he has been smoking cigarettes. He has a 18.75 pack-year smoking history. He has never used smokeless tobacco. He reports current alcohol use. He reports that he does not use drugs.  ROS: UROLOGY Frequent Urination?: Yes Hard to postpone urination?: Yes Burning/pain with urination?: No Get up at night to urinate?: Yes Leakage of urine?: No Urine stream starts and stops?: No Trouble starting stream?: No Do you have to strain to urinate?: No Blood in urine?: No Urinary tract infection?: No Sexually  transmitted disease?: No Injury to kidneys or bladder?: No Painful intercourse?: No Weak stream?: No Erection problems?: Yes Penile pain?: No  Gastrointestinal Nausea?: No Vomiting?: No Indigestion/heartburn?: No Diarrhea?: No Constipation?: No  Constitutional Fever: No Night sweats?: No Weight loss?: No Fatigue?: No  Skin Skin rash/lesions?: No Itching?: No  Eyes Blurred vision?: No Double vision?: No  Ears/Nose/Throat Sore throat?: No Sinus problems?: No  Hematologic/Lymphatic Swollen glands?: No Easy bruising?: No  Cardiovascular Leg swelling?: No Chest pain?: No  Respiratory Cough?: No Shortness  of breath?: No  Endocrine Excessive thirst?: No  Musculoskeletal Back pain?: No Joint pain?: No  Neurological Headaches?: No Dizziness?: No  Psychologic Depression?: No Anxiety?: No  Physical Exam: BP (!) 155/78   Pulse 74   Ht 6' (1.829 m)   Wt 260 lb (117.9 kg)   BMI 35.26 kg/m   Constitutional:  Alert and oriented, No acute distress. HEENT: [place] AT, moist mucus membranes.  Trachea midline, no masses. Cardiovascular: No clubbing, cyanosis, or edema. Respiratory: Normal respiratory effort, no increased work of breathing. GI: Abdomen is soft, nontender, nondistended, no abdominal masses GU: No CVA tenderness.  Prostate 40 g, smooth without nodules Lymph: No cervical or inguinal lymphadenopathy. Skin: No rashes, bruises or suspicious lesions. Neurologic: Grossly intact, no focal deficits, moving all 4 extremities. Psychiatric: Normal mood and affect.   Assessment & Plan:    - BPH with lower urinary tract symptoms Increase in storage related voiding symptoms.  Trial Myrbetriq 50 mg daily.  Samples given.  He will call back regarding efficacy.  - Erectile dysfunction Trial tadalafil 20 mg 1 hour prior to intercourse.  Return in about 1 year (around 02/05/2020) for Recheck.   Abbie Sons, [place] [address] [place], [place] [address] [phone]

## 2019-10-11 ENCOUNTER — Other Ambulatory Visit: Payer: Self-pay | Admitting: Orthopedic Surgery

## 2019-10-11 DIAGNOSIS — Z87828 Personal history of other (healed) physical injury and trauma: Secondary | ICD-10-CM

## 2019-10-11 DIAGNOSIS — G8929 Other chronic pain: Secondary | ICD-10-CM

## 2019-10-11 DIAGNOSIS — M25361 Other instability, right knee: Secondary | ICD-10-CM

## 2019-10-11 DIAGNOSIS — M25561 Pain in right knee: Secondary | ICD-10-CM

## 2019-10-11 DIAGNOSIS — M2351 Chronic instability of knee, right knee: Secondary | ICD-10-CM

## 2019-11-09 ENCOUNTER — Ambulatory Visit
Admission: RE | Admit: 2019-11-09 | Discharge: 2019-11-09 | Disposition: A | Discharge status: Home / Self Care | Payer: 59 | Source: Ambulatory Visit | Attending: Orthopedic Surgery | Admitting: Orthopedic Surgery

## 2019-11-09 DIAGNOSIS — Z87828 Personal history of other (healed) physical injury and trauma: Secondary | ICD-10-CM

## 2019-11-09 DIAGNOSIS — M2351 Chronic instability of knee, right knee: Secondary | ICD-10-CM

## 2019-11-09 DIAGNOSIS — G8929 Other chronic pain: Secondary | ICD-10-CM

## 2019-11-09 DIAGNOSIS — M25361 Other instability, right knee: Secondary | ICD-10-CM

## 2019-11-09 DIAGNOSIS — M25561 Pain in right knee: Secondary | ICD-10-CM

## 2020-02-07 ENCOUNTER — Ambulatory Visit: Payer: 59 | Admitting: Urology

## 2020-02-12 ENCOUNTER — Encounter: Payer: Self-pay | Admitting: Urology

## 2020-02-12 ENCOUNTER — Other Ambulatory Visit: Payer: Self-pay

## 2020-02-12 ENCOUNTER — Ambulatory Visit: Payer: 59 | Admitting: Urology

## 2020-02-12 VITALS — BP 137/71 | HR 71 | Ht 71.0 in | Wt 270.0 lb

## 2020-02-12 DIAGNOSIS — N401 Enlarged prostate with lower urinary tract symptoms: Secondary | ICD-10-CM | POA: Diagnosis not present

## 2020-02-12 DIAGNOSIS — N5201 Erectile dysfunction due to arterial insufficiency: Secondary | ICD-10-CM

## 2020-02-12 LAB — BLADDER SCAN AMB NON-IMAGING: Scan Result: 0

## 2020-02-12 NOTE — Progress Notes (Signed)
02/12/2020 10:23 AM   [name] Mar 12, 1956 [id number]  Referring provider: Medicine, [facility] [address] [place],  [place] [address]  Chief Complaint  Patient presents with  . Benign Prostatic Hypertrophy    Urologic history: 1.  BPH with lower urinary tract symptoms             -Tamsulosin 0.4 mg  2.  Erectile dysfunction             -Generic sildenafil 100 mg  HPI: 64 y.o. male presents for annual follow-up.   Ran out of tamsulosin 2 months ago and did not call for refill since appointment visit was scheduled  No worsening of his voiding symptoms since stopping tamsulosin  Symptoms have not significantly changed since last year with most bothersome symptoms urinary frequency and urgency  IPSS completed today 14/35  Denies dysuria, gross hematuria  Denies flank, abdominal or pelvic pain  PSA performed at PCP office 11/2019 stable at 0.68  Remains on sildenafil for ED   PMH: Past Medical History:  Diagnosis Date  . Arthritis   . ED (erectile dysfunction)   . Thyroid disease     Surgical History: Past Surgical History:  Procedure Laterality Date  . BACK SURGERY  1995, 2008  . COLONOSCOPY  2015  . FOOT FRACTURE SURGERY Right 2008  . HEMORRHOID SURGERY  06-30-15   incision thrombosed hemorrhoid Dr Evette Cristal    Home Medications:  Allergies as of 02/12/2020      Reactions   Daypro [oxaprozin] Itching      Medication List       Accurate as of February 12, 2020 10:23 AM. If you have any questions, ask your nurse or doctor.        ibuprofen 200 MG tablet Commonly known as: ADVIL Take by mouth.   ketoconazole 2 % cream Commonly known as: NIZORAL Apply topically.   levothyroxine 175 MCG tablet Commonly known as: SYNTHROID 150 mcg.   Multi-Vitamins Tabs Take by mouth.   tadalafil 20 MG tablet Commonly known as: CIALIS Take 1 tablet (20 mg total) by mouth daily as needed for erectile dysfunction.   tamsulosin 0.4 MG  Caps capsule Commonly known as: FLOMAX Take 0.4 mg by mouth daily.       Allergies:  Allergies  Allergen Reactions  . Daypro [Oxaprozin] Itching    Family History: Family History  Problem Relation Age of Onset  . Prostate cancer Neg Hx   . Kidney cancer Neg Hx   . Bladder Cancer Neg Hx     Social History:  reports that he has been smoking cigarettes. He has a 18.75 pack-year smoking history. He has never used smokeless tobacco. He reports current alcohol use. He reports that he does not use drugs.   Physical Exam: BP 137/71   Pulse 71   Ht 5\' 11"  (1.803 m)   Wt 270 lb (122.5 kg)   BMI 37.66 kg/m   Constitutional:  Alert and oriented, No acute distress. HEENT: [place] AT, moist mucus membranes.  Trachea midline, no masses. Cardiovascular: No clubbing, cyanosis, or edema. Respiratory: Normal respiratory effort, no increased work of breathing. GI: Abdomen is soft, nontender, nondistended, no abdominal masses GU: Prostate 35 g, smooth without nodules Skin: No rashes, bruises or suspicious lesions. Neurologic: Grossly intact, no focal deficits, moving all 4 extremities. Psychiatric: Normal mood and affect.   Assessment & Plan:    1. Benign prostatic hyperplasia with LUTS   Off tamsulosin x2 months without  worsening of symptoms  PVR by bladder scan 0 mL  Most bothersome symptoms are storage related (frequency/urgency)  Trial Gemtasa 75 mg daily, samples given  Call back 1 month regarding medication efficacy  2.  Erectile dysfunction  Stable on sildenafil   Riki Altes, MD  [facility] [address] [place], [place] [address] [phone]

## 2020-03-06 ENCOUNTER — Other Ambulatory Visit: Payer: Self-pay | Admitting: *Deleted

## 2020-03-06 MED ORDER — VIBEGRON 75 MG PO TABS: 75.0000 mg | ORAL_TABLET | Freq: Every day | ORAL | 6 refills | Status: DC

## 2020-03-10 ENCOUNTER — Other Ambulatory Visit: Payer: Self-pay | Admitting: Family Medicine

## 2020-03-10 MED ORDER — GEMTESA 75 MG PO TABS: 75.0000 mg | ORAL_TABLET | Freq: Every day | ORAL | 6 refills | Status: DC

## 2020-09-30 ENCOUNTER — Telehealth: Payer: Self-pay | Admitting: Family Medicine

## 2020-09-30 NOTE — Telephone Encounter (Signed)
LMOM for patient to return call. A PA was faxed for Leslye Peer but it was prescribed in October last year. Need to be sure he is taking this medication and if he has been paying out of pocket.

## 2020-10-02 ENCOUNTER — Other Ambulatory Visit: Payer: Self-pay | Admitting: Family Medicine

## 2020-10-02 MED ORDER — TAMSULOSIN HCL 0.4 MG PO CAPS: 0.4000 mg | ORAL_CAPSULE | Freq: Every day | ORAL | 1 refills | Status: DC

## 2020-10-02 NOTE — Telephone Encounter (Signed)
Okay to change back to tamsulosin

## 2020-10-02 NOTE — Telephone Encounter (Signed)
Spoke to patient and he states he has been taking Gemtesa for several months but he feels like the Tamsulosin works better. Also the Tamsulosin is more cost effective. Can we change the medication?

## 2020-10-02 NOTE — Telephone Encounter (Signed)
Left message on voicemail.

## 2021-02-11 ENCOUNTER — Other Ambulatory Visit: Payer: Self-pay

## 2021-02-11 ENCOUNTER — Encounter: Payer: Self-pay | Admitting: Urology

## 2021-02-11 ENCOUNTER — Ambulatory Visit (INDEPENDENT_AMBULATORY_CARE_PROVIDER_SITE_OTHER): Payer: 59 | Admitting: Urology

## 2021-02-11 VITALS — BP 140/70 | HR 82 | Ht 71.0 in | Wt 262.0 lb

## 2021-02-11 DIAGNOSIS — N5201 Erectile dysfunction due to arterial insufficiency: Secondary | ICD-10-CM

## 2021-02-11 DIAGNOSIS — Z125 Encounter for screening for malignant neoplasm of prostate: Secondary | ICD-10-CM

## 2021-02-11 DIAGNOSIS — N401 Enlarged prostate with lower urinary tract symptoms: Secondary | ICD-10-CM

## 2021-02-11 LAB — URINALYSIS, COMPLETE
Bilirubin, UA: NEGATIVE
Glucose, UA: NEGATIVE
Ketones, UA: NEGATIVE
Leukocytes,UA: NEGATIVE
Nitrite, UA: NEGATIVE
Protein,UA: NEGATIVE
RBC, UA: NEGATIVE
Specific Gravity, UA: 1.025 (ref 1.005–1.030)
Urobilinogen, Ur: 1 mg/dL (ref 0.2–1.0)
pH, UA: 5.5 (ref 5.0–7.5)

## 2021-02-11 LAB — MICROSCOPIC EXAMINATION
Bacteria, UA: NONE SEEN
RBC, Urine: NONE SEEN /hpf (ref 0–2)

## 2021-02-11 LAB — BLADDER SCAN AMB NON-IMAGING: SCA Result: 42

## 2021-02-11 MED ORDER — TADALAFIL 20 MG PO TABS: ORAL_TABLET | ORAL | 5 refills | Status: DC

## 2021-02-11 NOTE — Progress Notes (Signed)
02/11/2021 9:11 AM   [name] Feb 17, 1956 [id number]  Referring provider: [facility] [address] [place],  [place] [address]  Chief Complaint  Patient presents with   Benign Prostatic Hypertrophy    Urologic history: 1.  BPH with lower urinary tract symptoms Tamsulosin 0.4 mg   2.  Erectile dysfunction Generic tadalafil 20 mg  HPI: 65 y.o. male presents for annual follow-up.  At last years visit he had stopped his tamsulosin and was complaining of urinary frequency and urgency; IPSS 14/35 Since the symptoms had not worsened off tamsulosin he was given a trial of Gemtesa that in retrospect felt like the tamsulosin was more effective and started back on the tamsulosin April 2022 Denies dysuria, gross hematuria No flank, abdominal or pelvic pain PSA 11/26/2020 stable at 0.68 Tadalafil remains effective for ED   PMH: Past Medical History:  Diagnosis Date   Arthritis    ED (erectile dysfunction)    Thyroid disease     Surgical History: Past Surgical History:  Procedure Laterality Date   BACK SURGERY  1995, 2008   COLONOSCOPY  2015   FOOT FRACTURE SURGERY Right 2008   HEMORRHOID SURGERY  06-30-15   incision thrombosed hemorrhoid Dr Evette Cristal    Home Medications:  Allergies as of 02/11/2021       Reactions   Daypro [oxaprozin] Itching        Medication List        Accurate as of February 11, 2021  9:11 AM. If you have any questions, ask your nurse or doctor.          STOP taking these medications    Gemtesa 75 MG Tabs Generic drug: Vibegron Stopped by: Riki Altes, MD       TAKE these medications    ibuprofen 200 MG tablet Commonly known as: ADVIL Take by mouth.   ketoconazole 2 % cream Commonly known as: NIZORAL Apply topically.   levothyroxine 175 MCG tablet Commonly known as: SYNTHROID 150 mcg.   Multi-Vitamins Tabs Take by mouth.   tadalafil 20 MG tablet Commonly known as: CIALIS Take 1 tablet (20 mg  total) by mouth daily as needed for erectile dysfunction. What changed: Another medication with the same name was added. Make sure you understand how and when to take each. Changed by: Riki Altes, MD   tadalafil 20 MG tablet Commonly known as: CIALIS Take 1 tab one hour prior to intercourse What changed: You were already taking a medication with the same name, and this prescription was added. Make sure you understand how and when to take each. Changed by: Riki Altes, MD   tamsulosin 0.4 MG Caps capsule Commonly known as: FLOMAX Take 1 capsule (0.4 mg total) by mouth daily.        Allergies:  Allergies  Allergen Reactions   Daypro [Oxaprozin] Itching    Family History: Family History  Problem Relation Age of Onset   Prostate cancer Neg Hx    Kidney cancer Neg Hx    Bladder Cancer Neg Hx     Social History:  reports that he has been smoking cigarettes. He has a 18.75 pack-year smoking history. He has never used smokeless tobacco. He reports current alcohol use. He reports that he does not use drugs.   Physical Exam: BP 140/70   Pulse 82   Ht 5\' 11"  (1.803 m)   Wt 262 lb (118.8 kg)   BMI 36.54 kg/m   Constitutional:  Alert and oriented, No acute distress. HEENT: [place] AT, moist mucus membranes.  Trachea midline, no masses. Cardiovascular: No clubbing, cyanosis, or edema. Respiratory: Normal respiratory effort, no increased work of breathing. GU: Prostate 40 g, smooth without nodules Skin: No rashes, bruises or suspicious lesions. Neurologic: Grossly intact, no focal deficits, moving all 4 extremities. Psychiatric: Normal mood and affect.   Assessment & Plan:    BPH with LUTS Stable on tamsulosin Bladder scan PVR 42 mL  2.  Erectile dysfunction Stable Tadalafil refilled  3.  Prostate cancer screening Benign DRE Low and stable PSA  Continue annual follow-up   Riki Altes, MD  [facility] [address] [place], [place] [address] [phone]

## 2021-03-31 ENCOUNTER — Other Ambulatory Visit: Payer: Self-pay | Admitting: Urology

## 2021-06-28 ENCOUNTER — Other Ambulatory Visit: Payer: Self-pay | Admitting: Urology

## 2021-11-09 ENCOUNTER — Other Ambulatory Visit: Payer: Self-pay | Admitting: Orthopedic Surgery

## 2021-11-09 DIAGNOSIS — M23006 Cystic meniscus, unspecified meniscus, right knee: Secondary | ICD-10-CM

## 2021-11-15 ENCOUNTER — Ambulatory Visit
Admission: RE | Admit: 2021-11-15 | Discharge: 2021-11-15 | Disposition: A | Discharge status: Home / Self Care | Payer: 59 | Source: Ambulatory Visit | Attending: Orthopedic Surgery | Admitting: Orthopedic Surgery

## 2021-11-15 DIAGNOSIS — M23006 Cystic meniscus, unspecified meniscus, right knee: Secondary | ICD-10-CM

## 2021-11-22 ENCOUNTER — Other Ambulatory Visit: Payer: Self-pay | Admitting: Orthopedic Surgery

## 2021-11-25 ENCOUNTER — Encounter: Payer: Self-pay | Admitting: Orthopedic Surgery

## 2021-12-03 ENCOUNTER — Encounter: Payer: Self-pay | Admitting: Orthopedic Surgery

## 2021-12-03 ENCOUNTER — Ambulatory Visit: Payer: 59 | Admitting: Anesthesiology

## 2021-12-03 ENCOUNTER — Ambulatory Visit
Admission: RE | Admit: 2021-12-03 | Discharge: 2021-12-03 | Disposition: A | Discharge status: Home / Self Care | Payer: 59 | Attending: Orthopedic Surgery | Admitting: Orthopedic Surgery

## 2021-12-03 ENCOUNTER — Ambulatory Visit: Payer: 59

## 2021-12-03 ENCOUNTER — Other Ambulatory Visit: Payer: Self-pay

## 2021-12-03 ENCOUNTER — Encounter
Admission: RE | Disposition: A | Discharge status: Home / Self Care | Payer: Self-pay | Source: Home / Self Care | Attending: Orthopedic Surgery

## 2021-12-03 DIAGNOSIS — M1990 Unspecified osteoarthritis, unspecified site: Secondary | ICD-10-CM | POA: Insufficient documentation

## 2021-12-03 DIAGNOSIS — Z6837 Body mass index (BMI) 37.0-37.9, adult: Secondary | ICD-10-CM | POA: Diagnosis not present

## 2021-12-03 DIAGNOSIS — N400 Enlarged prostate without lower urinary tract symptoms: Secondary | ICD-10-CM | POA: Diagnosis not present

## 2021-12-03 DIAGNOSIS — S72421A Displaced fracture of lateral condyle of right femur, initial encounter for closed fracture: Secondary | ICD-10-CM | POA: Insufficient documentation

## 2021-12-03 DIAGNOSIS — M25861 Other specified joint disorders, right knee: Secondary | ICD-10-CM | POA: Diagnosis not present

## 2021-12-03 DIAGNOSIS — X58XXXA Exposure to other specified factors, initial encounter: Secondary | ICD-10-CM | POA: Diagnosis not present

## 2021-12-03 DIAGNOSIS — S82141A Displaced bicondylar fracture of right tibia, initial encounter for closed fracture: Secondary | ICD-10-CM | POA: Insufficient documentation

## 2021-12-03 DIAGNOSIS — E039 Hypothyroidism, unspecified: Secondary | ICD-10-CM | POA: Insufficient documentation

## 2021-12-03 DIAGNOSIS — S83281A Other tear of lateral meniscus, current injury, right knee, initial encounter: Secondary | ICD-10-CM | POA: Insufficient documentation

## 2021-12-03 DIAGNOSIS — F17200 Nicotine dependence, unspecified, uncomplicated: Secondary | ICD-10-CM | POA: Diagnosis not present

## 2021-12-03 HISTORY — DX: Hypothyroidism, unspecified: E03.9

## 2021-12-03 HISTORY — PX: KNEE ARTHROSCOPY: SHX127

## 2021-12-03 SURGERY — ARTHROSCOPY, KNEE
Anesthesia: General | Site: Knee | Laterality: Right

## 2021-12-03 MED ORDER — LACTATED RINGERS IR SOLN
Status: DC | PRN
  Administered 2021-12-03: 6000 mL

## 2021-12-03 MED ORDER — LACTATED RINGERS IV SOLN: INTRAVENOUS | Status: DC

## 2021-12-03 MED ORDER — LIDOCAINE HCL (CARDIAC) PF 100 MG/5ML IV SOSY
PREFILLED_SYRINGE | INTRAVENOUS | Status: DC | PRN
  Administered 2021-12-03: 40 mg via INTRATRACHEAL

## 2021-12-03 MED ORDER — GLYCOPYRROLATE 0.2 MG/ML IJ SOLN
INTRAMUSCULAR | Status: DC | PRN
  Administered 2021-12-03: .1 mg via INTRAVENOUS

## 2021-12-03 MED ORDER — HYDROCODONE-ACETAMINOPHEN 5-325 MG PO TABS: 1.0000 | ORAL_TABLET | ORAL | 0 refills | Status: DC | PRN

## 2021-12-03 MED ORDER — ASPIRIN 325 MG PO TBEC: 325.0000 mg | DELAYED_RELEASE_TABLET | Freq: Every day | ORAL | 0 refills | Status: AC

## 2021-12-03 MED ORDER — OXYCODONE HCL 5 MG PO TABS
5.0000 mg | ORAL_TABLET | Freq: Once | ORAL | Status: AC | PRN
  Administered 2021-12-03: 5 mg via ORAL

## 2021-12-03 MED ORDER — LIDOCAINE-EPINEPHRINE 1 %-1:100000 IJ SOLN
INTRAMUSCULAR | Status: DC | PRN
  Administered 2021-12-03: 20 mL via INTRAMUSCULAR

## 2021-12-03 MED ORDER — ONDANSETRON 4 MG PO TBDP: 4.0000 mg | ORAL_TABLET | Freq: Three times a day (TID) | ORAL | 0 refills | Status: DC | PRN

## 2021-12-03 MED ORDER — DEXAMETHASONE SODIUM PHOSPHATE 4 MG/ML IJ SOLN
INTRAMUSCULAR | Status: DC | PRN
  Administered 2021-12-03: 4 mg via INTRAVENOUS

## 2021-12-03 MED ORDER — ACETAMINOPHEN 500 MG PO TABS: 1000.0000 mg | ORAL_TABLET | Freq: Three times a day (TID) | ORAL | 2 refills | Status: AC

## 2021-12-03 MED ORDER — MIDAZOLAM HCL 5 MG/5ML IJ SOLN
INTRAMUSCULAR | Status: DC | PRN
  Administered 2021-12-03: 2 mg via INTRAVENOUS

## 2021-12-03 MED ORDER — CEFAZOLIN SODIUM-DEXTROSE 2-4 GM/100ML-% IV SOLN
2.0000 g | INTRAVENOUS | Status: AC
  Administered 2021-12-03: 2 g via INTRAVENOUS

## 2021-12-03 MED ORDER — EPHEDRINE SULFATE (PRESSORS) 50 MG/ML IJ SOLN
INTRAMUSCULAR | Status: DC | PRN
  Administered 2021-12-03: 5 mg via INTRAVENOUS
  Administered 2021-12-03: 10 mg via INTRAVENOUS

## 2021-12-03 MED ORDER — IBUPROFEN 800 MG PO TABS: 800.0000 mg | ORAL_TABLET | Freq: Three times a day (TID) | ORAL | 0 refills | Status: AC

## 2021-12-03 MED ORDER — FENTANYL CITRATE (PF) 100 MCG/2ML IJ SOLN
INTRAMUSCULAR | Status: DC | PRN
Start: 2021-12-03 — End: 2021-12-03
  Administered 2021-12-03: 25 ug via INTRAVENOUS
  Administered 2021-12-03: 50 ug via INTRAVENOUS
  Administered 2021-12-03 (×3): 25 ug via INTRAVENOUS

## 2021-12-03 MED ORDER — ONDANSETRON HCL 4 MG/2ML IJ SOLN
INTRAMUSCULAR | Status: DC | PRN
  Administered 2021-12-03: 4 mg via INTRAVENOUS

## 2021-12-03 MED ORDER — PROPOFOL 10 MG/ML IV BOLUS
INTRAVENOUS | Status: DC | PRN
  Administered 2021-12-03 (×2): 30 mg via INTRAVENOUS
  Administered 2021-12-03: 140 mg via INTRAVENOUS
  Administered 2021-12-03: 30 mg via INTRAVENOUS

## 2021-12-03 MED ORDER — OXYCODONE HCL 5 MG/5ML PO SOLN: 5.0000 mg | Freq: Once | ORAL | Status: AC | PRN

## 2021-12-03 SURGICAL SUPPLY — 48 items
ADAPTER IRRIG TUBE 2 SPIKE SOL (ADAPTER) ×4 IMPLANT
ADPR TBG 2 SPK PMP STRL ASCP (ADAPTER) ×2
APL PRP STRL LF DISP 70% ISPRP (MISCELLANEOUS) ×1
BLADE SHAVER 4.5X7 STR FR (MISCELLANEOUS) ×2 IMPLANT
BLADE SURG SZ11 CARB STEEL (BLADE) ×3 IMPLANT
BNDG ADH 5X4 AIR PERM ELC (GAUZE/BANDAGES/DRESSINGS) ×1
BNDG COHESIVE 4X5 WHT NS (GAUZE/BANDAGES/DRESSINGS) ×2 IMPLANT
BNDG ESMARK 6X12 TAN STRL LF (GAUZE/BANDAGES/DRESSINGS) ×2 IMPLANT
CANNULA ACCUPORT 11G 120M END (CANNULA) ×1 IMPLANT
CHLORAPREP W/TINT 26 (MISCELLANEOUS) ×2 IMPLANT
COOLER POLAR GLACIER W/PUMP (MISCELLANEOUS) ×2 IMPLANT
COVER LIGHT HANDLE UNIVERSAL (MISCELLANEOUS) ×4 IMPLANT
DRAPE EXTREMITY T 121X128X90 (DISPOSABLE) ×2 IMPLANT
DRAPE IMP U-DRAPE 54X76 (DRAPES) ×2 IMPLANT
GAUZE SPONGE 4X4 12PLY STRL (GAUZE/BANDAGES/DRESSINGS) ×2 IMPLANT
GLOVE SURG ENC MOIS LTX SZ7.5 (GLOVE) ×2 IMPLANT
GLOVE SURG UNDER LTX SZ8 (GLOVE) ×2 IMPLANT
GOWN STRL REIN 2XL XLG LVL4 (GOWN DISPOSABLE) ×2 IMPLANT
GOWN STRL REUS W/TWL LRG LVL3 (GOWN DISPOSABLE) ×2 IMPLANT
GRAFT FILLER BONE 5ML (Knees) IMPLANT
IV LACTATED RINGER IRRG 3000ML (IV SOLUTION) ×8
IV LR IRRIG 3000ML ARTHROMATIC (IV SOLUTION) ×4 IMPLANT
KIT ACCUFILL 5CC (Knees) ×1 IMPLANT
KIT KNEE SCP 414.502 (Knees) ×2 IMPLANT
KIT TURNOVER KIT A (KITS) ×2 IMPLANT
MANIFOLD NEPTUNE II (INSTRUMENTS) ×2 IMPLANT
MAT ABSORB  FLUID 56X50 GRAY (MISCELLANEOUS) ×4
MAT ABSORB FLUID 56X50 GRAY (MISCELLANEOUS) ×1 IMPLANT
NDL SAFETY ECLIPSE 18X1.5 (NEEDLE) ×1 IMPLANT
NEEDLE HYPO 18GX1.5 SHARP (NEEDLE) ×2
PACK ARTHROSCOPY KNEE (MISCELLANEOUS) ×2 IMPLANT
PAD ABD DERMACEA PRESS 5X9 (GAUZE/BANDAGES/DRESSINGS) ×2 IMPLANT
PAD WRAPON POLAR KNEE (MISCELLANEOUS) ×1 IMPLANT
PADDING CAST BLEND 6X4 STRL (MISCELLANEOUS) ×1 IMPLANT
PADDING STRL CAST 6IN (MISCELLANEOUS) ×1
SUT ETHILON 3-0 FS-10 30 BLK (SUTURE) ×2
SUT MNCRL 4-0 (SUTURE) ×2
SUT MNCRL 4-0 27XMFL (SUTURE) ×1
SUT VIC AB 0 CT1 36 (SUTURE) ×1 IMPLANT
SUT VIC AB 2-0 CT2 27 (SUTURE) ×1 IMPLANT
SUTURE EHLN 3-0 FS-10 30 BLK (SUTURE) ×1 IMPLANT
SUTURE MNCRL 4-0 27XMF (SUTURE) IMPLANT
SYR 5ML LL (SYRINGE) ×2 IMPLANT
TOWEL OR 17X26 4PK STRL BLUE (TOWEL DISPOSABLE) ×4 IMPLANT
TUBING INFLOW SET DBFLO PUMP (TUBING) ×2 IMPLANT
TUBING OUTFLOW SET DBLFO PUMP (TUBING) ×2 IMPLANT
WAND WEREWOLF FLOW 90D (MISCELLANEOUS) ×2 IMPLANT
WRAPON POLAR PAD KNEE (MISCELLANEOUS) ×2

## 2021-12-03 NOTE — Anesthesia Procedure Notes (Signed)
Procedure Name: LMA Insertion Date/Time: 12/03/2021 11:49 AM  Performed by: Jimmy Picket, CRNAPre-anesthesia Checklist: Patient identified, Emergency Drugs available, Suction available, Timeout performed and Patient being monitored Patient Re-evaluated:Patient Re-evaluated prior to induction Oxygen Delivery Method: Circle system utilized Preoxygenation: Pre-oxygenation with 100% oxygen Induction Type: IV induction LMA: LMA inserted LMA Size: 5.0 Number of attempts: 1 Placement Confirmation: positive ETCO2 and breath sounds checked- equal and bilateral Tube secured with: Tape

## 2021-12-03 NOTE — H&P (Signed)
Paper H&P to be scanned into permanent record. H&P reviewed. No significant changes noted.  

## 2021-12-03 NOTE — Transfer of Care (Signed)
Immediate Anesthesia Transfer of Care Note  Patient: [name]  Procedure(s) Performed: Right knee arthroscopy, partial lateral meniscectomy, subchondroplasty of the lateral femoral condyle and lateral tibial plateau, and open lateral parameniscal cyst excision (Right: Knee)  Patient Location: PACU  Anesthesia Type: General  Level of Consciousness: awake, alert  and patient cooperative  Airway and Oxygen Therapy: Patient Spontanous Breathing and Patient connected to supplemental oxygen  Post-op Assessment: Post-op Vital signs reviewed, Patient's Cardiovascular Status Stable, Respiratory Function Stable, Patent Airway and No signs of Nausea or vomiting  Post-op Vital Signs: Reviewed and stable  Complications: No notable events documented.

## 2021-12-03 NOTE — Op Note (Addendum)
Operative Note    SURGERY DATE: 12/03/2021   PRE-OP DIAGNOSIS:  1.  Right subchondral insufficiency fracture of lateral tibial plateau 2.  Right subchondral insufficiency fracture of lateral femoral condyle 3.  Right lateral meniscus tear 4.  Right lateral parameniscal cyst    POST-OP DIAGNOSIS:  1.  Right subchondral insufficiency fracture of lateral tibial plateau 2.  Right subchondral insufficiency fracture of lateral femoral condyle 3.  Right lateral meniscus tear 4.  Right lateral parameniscal cyst    PROCEDURES:  1. Right knee arthroscopically assisted subchondroplasty of lateral tibial plateau and lateral femoral condyle (treatment of medial tibial plateau fracture and medial femoral condyle fracture) 2. Right knee arthroscopy, partial lateral meniscectomy 3. Right open lateral parameniscal cyst excision 4. Right knee chondroplasty of lateral and medial compartments   SURGEON: Rosealee Albee, MD  ASSISTANT: Berdine Addison, PA-S   ANESTHESIA: Gen   ESTIMATED BLOOD LOSS: minimal   TOTAL IV FLUIDS: per anesthesia  SPECIMEN: Lateral parameniscal cyst sent for pathology   INDICATION(S):  [name] is a 66 y.o. male with signs and symptoms as well as MRI finding of severe bone marrow edema and subchondral insufficiency fractures of the lateral tibial plateau and lateral femoral condyle in the setting of moderate degenerative changes of the lateral compartment.  Additionally, MRI showed a significantly degenerative lateral meniscus tear with a large amount of extrusion as well as a large, approximately 5 x 2 cm lateral parameniscal cyst.  The patient underwent extensive nonsurgical management in the form of activity modifications, corticosteroid injections, and medical management without improvement in symptoms.  After discussion of risks, benefits, and alternatives to surgery, the patient elected to proceed.  The patient understands that he may still end up needing an  arthroplasty type procedure in the future.   OPERATIVE FINDINGS:    Examination under anesthesia: A careful examination under anesthesia was performed.  Passive range of motion was: Hyperextension: 1.  Extension: 0.  Flexion: 115.  Lachman: normal. Pivot Shift: normal.  Posterior drawer: normal.  Varus stability in full extension: normal.  Varus stability in 30 degrees of flexion: normal.  Valgus stability in full extension: normal.  Valgus stability in 30 degrees of flexion: normal.   Intra-operative findings: A thorough arthroscopic examination of the knee was performed.  The findings are: 1. Suprapatellar pouch: Normal 2. Undersurface of median ridge: Grade 2 degenerative changes distally 3. Medial patellar facet: Grade 1 degenerative change 4. Lateral patellar facet: Grade 1 degenerative changes 5. Trochlea: Grade 2 degenerative changes over central trochlea 6. Lateral gutter/popliteus tendon: Extruded meniscus present 7. Hoffa's fat pad: Inflamed 8. Medial gutter/plica: Normal 9. ACL: Normal 10. PCL: Normal 11. Medial meniscus: Normal  12. Medial compartment cartilage: Focal area on the medial femoral condyle measuring approximately 12 x 8 mm of grade 3-4 degenerative changes; normal medial tibial plateau 13. Lateral meniscus: Severe complex, degenerative tearing of the anterior horn and body of the lateral meniscus.  There was a significant horizontal tear extending the full width of the meniscus as well as a radial component at the body.  The meniscus was severely extruded. 14. Lateral compartment cartilage: Grade 4 degenerative changes centrally on the tibial plateau and grade 3-4 degenerative changes to the lateral aspect of the lateral femoral condyle   OPERATIVE REPORT:     I identified [name] in the pre-operative holding area. I marked the operative knee with my initials. I reviewed the risks and benefits of the proposed surgical intervention  and the patient (and/or  patient's guardian) wished to proceed.  The patient underwent regional anesthesia in the preoperative holding area.  The patient was transferred to the operative suite and placed in the supine position with all bony prominences padded.  Anesthesia was administered. Appropriate IV antibiotics were administered prior to incision. The extremity was then prepped and draped in standard fashion. A time out was performed confirming the correct extremity, correct patient, and correct procedure.  Given the size of the lateral parameniscal cyst, an open approach was utilized first.  An approximately 5 cm incision was made over the distal aspect of the lateral femoral condyle anterior to the LCL and overlying the IT band.  The IT band was incised in line with the skin incision.  The parameniscal cyst was visualized just deep to the IT band.  Cyst was filled with joint fluid and all of the visualized cyst walls were excised.  Specimen was sent for pathology.    Next, subchondroplasty was performed to address the insufficiency fractures of the lateral femoral condyle and lateral tibial plateau.  Using fluoroscopic guidance and correlation with the patient's MRI, a trocar with cannula was drilled into the site of the subchondral insufficiency fracture on the lateral femoral condyle such that all of the flutes were within bone.  This was performed through the previously made open incision for parameniscal cyst excision.  This was similarly repeated for the lateral tibial plateau subchondral insufficiency fracture, but this was approached through a percutaneous stab incision after appropriately localizing with fluoroscopy.  Calcium phosphate mixture was appropriately mixed and 3 cc of calcium phosphate were injected through the cannula into the lateral femoral condyle subchondral insufficiency fracture.  Appropriate filling was seen on fluoroscopy.  2 cc was then injected into the lateral tibial plateau subchondral  insufficiency fracture.  Cannulas were left in place until calcium phosphate material had completely hardened.  Arthroscopy portals were marked. Local anesthetic was injected to the planned portal sites. The anterolateral portal was established with an 11 blade. The arthroscope was placed in the anterolateral portal and then into the suprapatellar pouch.  A diagnostic knee scope was completed with the above findings.  We confirmed that there was no subchondroplasty material within the knee joint.  The lateral meniscus tear was identified.   Next the medial portal was established under needle localization. The meniscal tear was debrided using an arthroscopic biter and an oscillating shaver until the meniscus had stable borders.  This was done with the assistance of a 70 degree arthroscope.  Meniscectomy involved excision of the entire meniscus body and significant portions of the anterior horn.  A chondroplasty was performed of the lateral compartment and medial compartments such that there were stable cartilage edges without any loose fragments of cartilage.  The arthroscope was withdrawn from the joint.   The incisions were closed with 3-0 Nylon suture. Sterile dressings included Xeroform, 4x4s, Sof-Rol, and Bias wrap. A Polarcare was placed.  The patient was then awakened and taken to the PACU hemodynamically stable without complication.     POSTOPERATIVE PLAN: The patient will be discharged home today. Aspirin 325 mg daily x 2 weeks for DVT prophylaxis.  Physical therapy will start on POD#3-5. Weight-bearing as tolerated. Follow up in 2 weeks per protocol.

## 2021-12-03 NOTE — Discharge Instructions (Signed)
Arthroscopic Knee Surgery - Partial Meniscectomy   Post-Op Instructions   1. Bracing or crutches: Crutches will be provided at the time of discharge from the surgery center if you do not already have them.   2. Ice: You may be provided with a device (Polar Care) that allows you to ice the affected area effectively. Otherwise you can ice manually.    3. Driving:  Plan on not driving for at least two weeks. Please note that you are advised NOT to drive while taking narcotic pain medications as you may be impaired and unsafe to drive.   4. Activity: Ankle pumps several times an hour while awake to prevent blood clots. Weight bearing: as tolerated. Use crutches for as needed (usually ~1 week or less) until pain allows you to ambulate without a limp. Bending and straightening the knee is unlimited. Elevate knee above heart level as much as possible for one week. Avoid standing more than 5 minutes (consecutively) for the first week.  Avoid long distance travel for 2 weeks.  5. Medications:  - You have been provided a prescription for narcotic pain medicine. After surgery, take 1-2 narcotic tablets every 4 hours if needed for severe pain.  - You may take up to 3000mg/day of tylenol (acetaminophen). You can take 1000mg 3x/day. Please check your narcotic. If you have acetaminophen in your narcotic (each tablet will be 325mg), be careful not to exceed a total of 3000mg/day of acetaminophen.  - A prescription for anti-nausea medication will be provided in case the narcotic medicine or anesthesia causes nausea - take 1 tablet every 6 hours only if nauseated.  - Take ibuprofen 800 mg every 8 hours WITH food to reduce post-operative knee swelling. DO NOT STOP IBUPROFEN POST-OP UNTIL INSTRUCTED TO DO SO at first post-op office visit (10-14 days after surgery). However, please discontinue if you have any abdominal discomfort after taking this.  - Take enteric coated aspirin 325 mg once daily for 2 weeks to prevent  blood clots.    6. Bandages: The physical therapist should change the bandages at the first post-op appointment. If needed, the dressing supplies have been provided to you.   7. Physical Therapy: 1-2 times per week for 6 weeks. Therapy typically starts on post operative Day 3 or 4. You have been provided an order for physical therapy. The therapist will provide home exercises.   8. Work: May return to full work usually around 2 weeks after 1st post-operative visit. May do light duty/desk job in approximately 1-2 weeks when off of narcotics, pain is well-controlled, and swelling has decreased. Labor intensive jobs may require 4-6 weeks to return.      9. Post-Op Appointments: Your first post-op appointment will be with Dr. Katoya Amato in approximately 2 weeks time.    If you find that they have not been scheduled please call the Orthopaedic Appointment front desk at [phone].  

## 2021-12-03 NOTE — Anesthesia Postprocedure Evaluation (Signed)
Anesthesia Post Note  Patient: SUEDE Lutz  Procedure(s) Performed: Right knee arthroscopy, partial lateral meniscectomy, subchondroplasty of the lateral femoral condyle and lateral tibial plateau, and open lateral parameniscal cyst excision (Right: Knee)     Patient location during evaluation: PACU Anesthesia Type: General Level of consciousness: awake and alert Pain management: pain level controlled Vital Signs Assessment: post-procedure vital signs reviewed and stable Respiratory status: spontaneous breathing, nonlabored ventilation, respiratory function stable and patient connected to nasal cannula oxygen Cardiovascular status: blood pressure returned to baseline and stable Postop Assessment: no apparent nausea or vomiting Anesthetic complications: no   No notable events documented.  [name]

## 2021-12-03 NOTE — Anesthesia Preprocedure Evaluation (Signed)
Anesthesia Evaluation  Patient identified by MRN, date of birth, ID band Patient awake    Reviewed: NPO status   History of Anesthesia Complications Negative for: history of anesthetic complications  Airway Mallampati: II  TM Distance: >3 FB Neck ROM: full    Dental no notable dental hx.    Pulmonary neg pulmonary ROS, Current Smoker and Patient abstained from smoking.,    Pulmonary exam normal        Cardiovascular Exercise Tolerance: Good negative cardio ROS Normal cardiovascular exam     Neuro/Psych negative neurological ROS  negative psych ROS   GI/Hepatic negative GI ROS, Neg liver ROS,   Endo/Other  Hypothyroidism Morbid obesity (bmi=37)  Renal/GU negative Renal ROS   bph     Musculoskeletal  (+) Arthritis ,   Abdominal   Peds  Hematology negative hematology ROS (+)   Anesthesia Other Findings Pcp: Mechele Collin, NP at 11/30/2021   Reproductive/Obstetrics                             Anesthesia Physical Anesthesia Plan  ASA: 2  Anesthesia Plan: General   Post-op Pain Management:    Induction:   PONV Risk Score and Plan: 2 and Midazolam and Ondansetron  Airway Management Planned:   Additional Equipment:   Intra-op Plan:   Post-operative Plan:   Informed Consent: I have reviewed the patients History and Physical, chart, labs and discussed the procedure including the risks, benefits and alternatives for the proposed anesthesia with the patient or authorized representative who has indicated his/her understanding and acceptance.       Plan Discussed with: CRNA  Anesthesia Plan Comments:         Anesthesia Quick Evaluation

## 2021-12-06 ENCOUNTER — Encounter: Payer: Self-pay | Admitting: Orthopedic Surgery

## 2022-02-10 ENCOUNTER — Ambulatory Visit: Payer: 59 | Admitting: Urology

## 2022-02-11 ENCOUNTER — Ambulatory Visit: Payer: 59 | Admitting: Urology

## 2022-02-17 ENCOUNTER — Ambulatory Visit: Payer: 59 | Admitting: Urology

## 2022-02-21 ENCOUNTER — Ambulatory Visit (INDEPENDENT_AMBULATORY_CARE_PROVIDER_SITE_OTHER): Payer: 59 | Admitting: Urology

## 2022-02-21 VITALS — BP 173/70 | HR 76 | Ht 72.0 in | Wt 270.0 lb

## 2022-02-21 DIAGNOSIS — N5201 Erectile dysfunction due to arterial insufficiency: Secondary | ICD-10-CM

## 2022-02-21 DIAGNOSIS — N401 Enlarged prostate with lower urinary tract symptoms: Secondary | ICD-10-CM | POA: Diagnosis not present

## 2022-02-21 LAB — BLADDER SCAN AMB NON-IMAGING: Scan Result: 13

## 2022-02-21 MED ORDER — TAMSULOSIN HCL 0.4 MG PO CAPS: 0.8000 mg | ORAL_CAPSULE | Freq: Two times a day (BID) | ORAL | 0 refills | Status: DC

## 2022-02-21 MED ORDER — TADALAFIL 20 MG PO TABS
20.0000 mg | ORAL_TABLET | Freq: Every day | ORAL | 3 refills | Status: DC | PRN
Start: 2022-02-21 — End: 2023-02-22

## 2022-02-21 NOTE — Progress Notes (Unsigned)
02/21/2022 7:25 AM   [name] 27-Mar-1956 [id number]  Referring provider: [facility] [address] [place],  [place] [address]  Chief Complaint  Patient presents with   Benign Prostatic Hypertrophy    Urologic history: 1.  BPH with lower urinary tract symptoms Tamsulosin 0.4 mg   2.  Erectile dysfunction Generic tadalafil 20 mg  HPI: 66 y.o. male presents for annual follow-up.  Overall doing well since last years visit though has noted some mild increased urgency and postvoid dribbling.  Was inquiring if his tamsulosin dose could be increased Denies dysuria, gross hematuria No flank, abdominal or pelvic pain PSA 6/27/202 stable at 0.66 Tadalafil remains effective for ED   PMH: Past Medical History:  Diagnosis Date   Arthritis    ED (erectile dysfunction)    Hypothyroidism    Thyroid disease     Surgical History: Past Surgical History:  Procedure Laterality Date   [place], 2008   COLONOSCOPY  2015   FOOT FRACTURE SURGERY Right 2008   HEMORRHOID SURGERY  06-30-15   incision thrombosed hemorrhoid Dr Jamal Collin   KNEE ARTHROSCOPY Right 12/03/2021   Procedure: Right knee arthroscopy, partial lateral meniscectomy, subchondroplasty of the lateral femoral condyle and lateral tibial plateau, and open lateral parameniscal cyst excision;  Surgeon: Leim Fabry, MD;  Location: [facility];  Service: Orthopedics;  Laterality: Right;    Home Medications:  Allergies as of 02/21/2022       Reactions   Daypro [oxaprozin] Hives, Itching        Medication List        Accurate as of February 21, 2022 11:59 PM. If you have any questions, ask your nurse or doctor.          acetaminophen 500 MG tablet Commonly known as: TYLENOL Take 2 tablets (1,000 mg total) by mouth every 8 (eight) hours.   cyanocobalamin 1000 MCG tablet Commonly known as: VITAMIN B12 Take 1,000 mcg by mouth daily.   HYDROcodone-acetaminophen 5-325 MG  tablet Commonly known as: Norco Take 1-2 tablets by mouth every 4 (four) hours as needed for moderate pain or severe pain.   ketoconazole 2 % cream Commonly known as: NIZORAL Apply topically.   levothyroxine 175 MCG tablet Commonly known as: SYNTHROID 150 mcg.   Multi-Vitamins Tabs Take by mouth.   ondansetron 4 MG disintegrating tablet Commonly known as: ZOFRAN-ODT Take 1 tablet (4 mg total) by mouth every 8 (eight) hours as needed for nausea or vomiting.   tadalafil 20 MG tablet Commonly known as: CIALIS Take 1 tab one hour prior to intercourse   tadalafil 20 MG tablet Commonly known as: CIALIS Take 1 tablet (20 mg total) by mouth daily as needed for erectile dysfunction.   tamsulosin 0.4 MG Caps capsule Commonly known as: FLOMAX Take 2 capsules (0.8 mg total) by mouth in the morning and at bedtime. What changed:  how much to take when to take this   Vitamin D3 50 MCG (2000 UT) Tabs Take by mouth daily.        Allergies:  Allergies  Allergen Reactions   Daypro [Oxaprozin] Hives and Itching    Family History: Family History  Problem Relation Age of Onset   Prostate cancer Neg Hx    Kidney cancer Neg Hx    Bladder Cancer Neg Hx     Social History:  reports that he has been smoking cigarettes. He has a 22.50 pack-year smoking history. He has never used  smokeless tobacco. He reports current alcohol use. He reports that he does not use drugs.   Physical Exam: There were no vitals taken for this visit.  Constitutional:  Alert and oriented, No acute distress. HEENT: [place] AT Respiratory: Normal respiratory effort, no increased work of breathing. Skin: No rashes, bruises or suspicious lesions. Neurologic: Grossly intact, no focal deficits, moving all 4 extremities. Psychiatric: Normal mood and affect.   Assessment & Plan:    BPH with LUTS Some increased urgency/postvoid dribbling.  Increase tamsulosin to 0.8 mg Bladder scan PVR 18 mL  2.  Erectile  dysfunction Stable Tadalafil refilled  Continue annual follow-up   Riki Altes, MD  [facility] [address], [address] [place], [place] [address] [phone]

## 2022-02-22 ENCOUNTER — Encounter: Payer: Self-pay | Admitting: Urology

## 2022-02-23 ENCOUNTER — Encounter: Payer: Self-pay | Admitting: Urology

## 2022-03-20 ENCOUNTER — Other Ambulatory Visit: Payer: Self-pay | Admitting: Urology

## 2022-05-18 ENCOUNTER — Other Ambulatory Visit: Payer: Self-pay | Admitting: Urology

## 2022-05-18 ENCOUNTER — Other Ambulatory Visit: Payer: Self-pay

## 2022-05-18 MED ORDER — TAMSULOSIN HCL 0.4 MG PO CAPS: 0.8000 mg | ORAL_CAPSULE | Freq: Every day | ORAL | 3 refills | Status: DC

## 2022-05-18 NOTE — Telephone Encounter (Signed)
Patient called requesting a refill of tamsulosin.  Refill sent.

## 2023-02-22 ENCOUNTER — Encounter: Payer: Self-pay | Admitting: Urology

## 2023-02-22 ENCOUNTER — Ambulatory Visit (INDEPENDENT_AMBULATORY_CARE_PROVIDER_SITE_OTHER): Payer: 59 | Admitting: Urology

## 2023-02-22 VITALS — BP 119/69 | HR 74 | Ht 71.0 in | Wt 263.3 lb

## 2023-02-22 DIAGNOSIS — N401 Enlarged prostate with lower urinary tract symptoms: Secondary | ICD-10-CM

## 2023-02-22 DIAGNOSIS — N5201 Erectile dysfunction due to arterial insufficiency: Secondary | ICD-10-CM | POA: Diagnosis not present

## 2023-02-22 DIAGNOSIS — Z125 Encounter for screening for malignant neoplasm of prostate: Secondary | ICD-10-CM

## 2023-02-22 LAB — URINALYSIS, COMPLETE
Bilirubin, UA: NEGATIVE
Glucose, UA: NEGATIVE
Ketones, UA: NEGATIVE
Leukocytes,UA: NEGATIVE
Nitrite, UA: NEGATIVE
Protein,UA: NEGATIVE
RBC, UA: NEGATIVE
Specific Gravity, UA: 1.02 (ref 1.005–1.030)
Urobilinogen, Ur: 1 mg/dL (ref 0.2–1.0)
pH, UA: 6.5 (ref 5.0–7.5)

## 2023-02-22 LAB — MICROSCOPIC EXAMINATION: Bacteria, UA: NONE SEEN

## 2023-02-22 LAB — BLADDER SCAN AMB NON-IMAGING

## 2023-02-22 MED ORDER — TADALAFIL 20 MG PO TABS: 20.0000 mg | ORAL_TABLET | Freq: Every day | ORAL | 0 refills | Status: AC | PRN

## 2023-02-22 NOTE — Progress Notes (Signed)
I, Duke Salvia, acting as a Neurosurgeon for Riki Altes, MD., have documented all relevant documentation on the behalf of Riki Altes, MD,  as directed by  Riki Altes, MD while in the presence of Riki Altes, MD.  02/22/2023 3:03 PM   [name] 04/30/1956 [id number]  Referring provider: [facility] [address] [place],  [place] [address]  Chief Complaint  Patient presents with   Benign Prostatic Hypertrophy    Urologic history: 1.  BPH with lower urinary tract symptoms Tamsulosin 0.8 mg   2.  Erectile dysfunction Generic tadalafil 20 mg   HPI: 67 y.o. male presents for annual follow-up.  His post void dribbling significantly improved on titrating tamsulosin dose to 0.8 mg IPSS today 16/35. He has nocturia x3. PSA 12/02/22 stable at 0.71 On Tadalafil with good efficacy for his ED. Denies dysuria, gross hematuria No flank, abdominal or pelvic pain   PMH: Past Medical History:  Diagnosis Date   Arthritis    ED (erectile dysfunction)    Hypothyroidism    Thyroid disease     Surgical History: Past Surgical History:  Procedure Laterality Date   BACK SURGERY  1995, 2008   COLONOSCOPY  2015   FOOT FRACTURE SURGERY Right 2008   HEMORRHOID SURGERY  06-30-15   incision thrombosed hemorrhoid Dr Evette Cristal   KNEE ARTHROSCOPY Right 12/03/2021   Procedure: Right knee arthroscopy, partial lateral meniscectomy, subchondroplasty of the lateral femoral condyle and lateral tibial plateau, and open lateral parameniscal cyst excision;  Surgeon: Signa Kell, MD;  Location: [facility];  Service: Orthopedics;  Laterality: Right;    Home Medications:  Allergies as of 02/22/2023       Reactions   Daypro [oxaprozin] Hives, Itching        Medication List        Accurate as of February 22, 2023  3:03 PM. If you have any questions, ask your nurse or doctor.          STOP taking these medications    HYDROcodone-acetaminophen 5-325 MG  tablet Commonly known as: Norco Stopped by: Riki Altes   ketoconazole 2 % cream Commonly known as: NIZORAL Stopped by: Verna Czech Emelin Dascenzo   ondansetron 4 MG disintegrating tablet Commonly known as: ZOFRAN-ODT Stopped by: Riki Altes       TAKE these medications    acetaminophen 500 MG tablet Commonly known as: TYLENOL Take 1,000 mg by mouth every 6 (six) hours as needed.   amoxicillin 500 MG tablet Commonly known as: AMOXIL TAKE 4 TABLETS BY MOUTH 1 HOUR BEFORE DENTAL APPOINTMENT   cyanocobalamin 1000 MCG tablet Commonly known as: VITAMIN B12 Take 1,000 mcg by mouth daily.   levothyroxine 150 MCG tablet Commonly known as: SYNTHROID Take 150 mcg by mouth every morning. What changed: Another medication with the same name was removed. Continue taking this medication, and follow the directions you see here. Changed by: Riki Altes   Multi-Vitamins Tabs Take by mouth.   tadalafil 20 MG tablet Commonly known as: CIALIS Take 1 tablet (20 mg total) by mouth daily as needed for erectile dysfunction. What changed: Another medication with the same name was removed. Continue taking this medication, and follow the directions you see here. Changed by: Riki Altes   tamsulosin 0.4 MG Caps capsule Commonly known as: FLOMAX TAKE 2 CAPSULES BY MOUTH IN THE MORNING AND AT BEDTIME   Vitamin D3 50 MCG (2000 UT) Tabs Take  by mouth daily.        Allergies:  Allergies  Allergen Reactions   Daypro [Oxaprozin] Hives and Itching    Family History: Family History  Problem Relation Age of Onset   Prostate cancer Neg Hx    Kidney cancer Neg Hx    Bladder Cancer Neg Hx     Social History:  reports that he has been smoking cigarettes. He has a 22.5 pack-year smoking history. He has never used smokeless tobacco. He reports current alcohol use. He reports that he does not use drugs.   Physical Exam: BP 119/69 (BP Location: Left Arm, Patient Position: Sitting, Cuff  Size: Large)   Pulse 74   Ht 5\' 11"  (1.803 m)   Wt 263 lb 4.8 oz (119.4 kg)   BMI 36.72 kg/m   Constitutional:  Alert and oriented, No acute distress. HEENT: [place] AT Respiratory: Normal respiratory effort, no increased work of breathing. Psychiatric: Normal mood and affect.   Assessment & Plan:    BPH with LUTS Moderate LUTS, which are stable. PVR today 0 mL. Continue tamsulosin  2.  Erectile dysfunction Stable. Tadalafil refilled.  3. Prostate cancer screening Guidelines of PSA and DRE between the ages of 31-69. He declined DRE today.   I have reviewed the above documentation for accuracy and completeness, and I agree with the above.   Riki Altes, MD  [facility] [address], [address] [place], [place] [address] [phone]

## 2023-04-30 ENCOUNTER — Other Ambulatory Visit: Payer: Self-pay | Admitting: Urology

## 2023-10-16 ENCOUNTER — Other Ambulatory Visit: Payer: Self-pay | Admitting: Urology

## 2023-12-06 ENCOUNTER — Other Ambulatory Visit: Payer: Self-pay | Admitting: Gerontology

## 2023-12-06 DIAGNOSIS — Z136 Encounter for screening for cardiovascular disorders: Secondary | ICD-10-CM

## 2023-12-22 ENCOUNTER — Ambulatory Visit
Admission: RE | Admit: 2023-12-22 | Discharge: 2023-12-22 | Disposition: A | Discharge status: Home / Self Care | Source: Ambulatory Visit | Attending: Gerontology | Admitting: Gerontology

## 2023-12-22 DIAGNOSIS — Z136 Encounter for screening for cardiovascular disorders: Secondary | ICD-10-CM | POA: Diagnosis present

## 2024-01-12 IMAGING — MR MR KNEE*R* W/O CM
6 series · 39 of 40 positions shown · non-contrast
Comparison: MRI 11/09/2019

CLINICAL DATA: Constant knee pain

EXAM:
MRI OF THE RIGHT KNEE WITHOUT CONTRAST
TECHNIQUE: Multiplanar, multisequence MR imaging of the knee was performed. No
intravenous contrast was administered.

[Series 3: T2 fat-sat · axial · right · 4.0mm · 0.47mm/px · z∈[-63,+87]mm · 6 of 31 slices shown (1 of 3)]
[im 1/31]
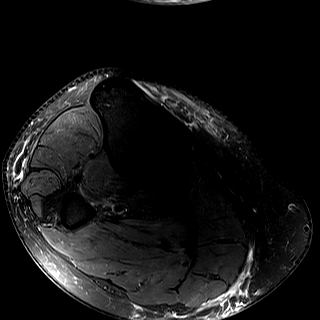
[im 7/31]
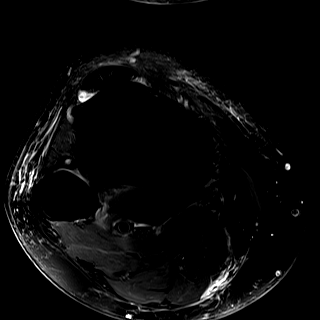
[im 13/31]
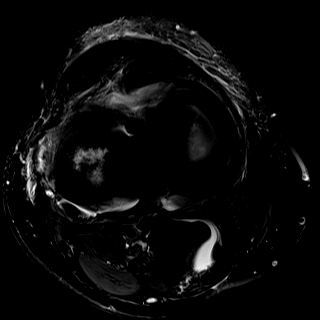
[im 19/31]
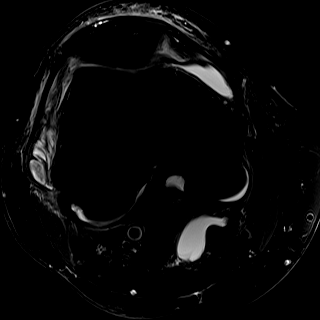
[im 25/31]
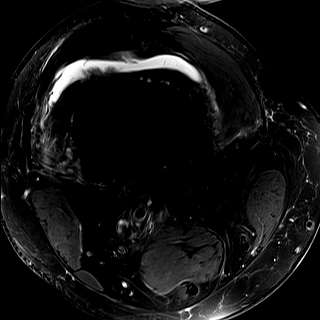
[im 31/31]
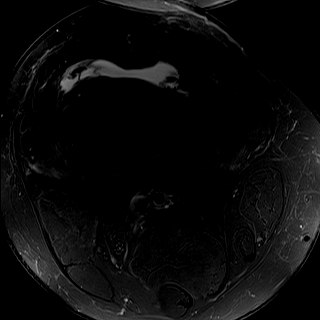

[Series 4: T1 · coronal · right · 4.0mm · 0.50mm/px · 5 of 28 slices shown]
[im 1/28]
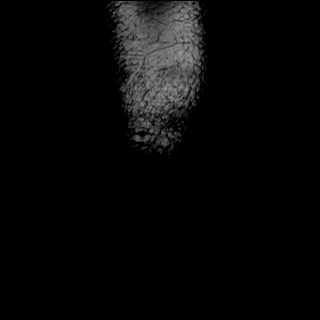
[im 6/28]
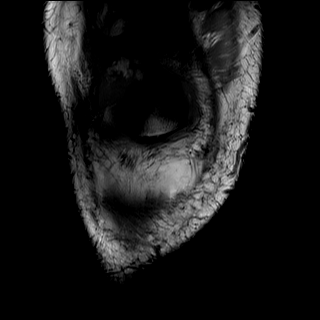
[im 11/28]
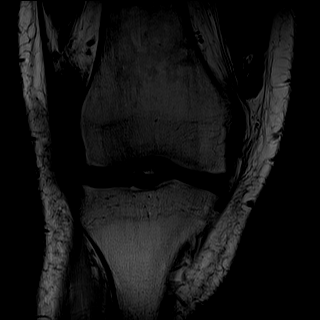
[im 17/28]
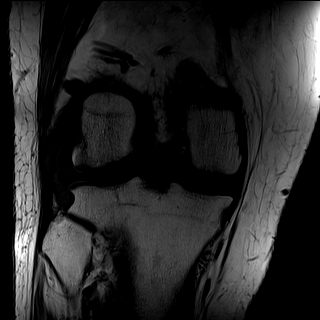
[im 22/28]
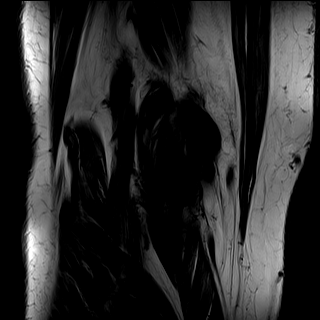

[Series 5: T2 fat-sat · coronal · right · 4.0mm · 0.50mm/px · 6 of 28 slices shown (2 of 3)]
[im 1/28]
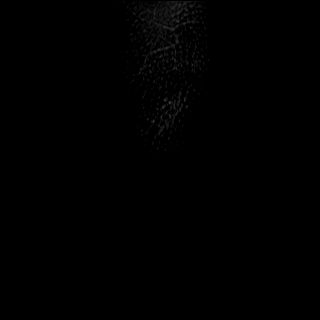
[im 6/28]
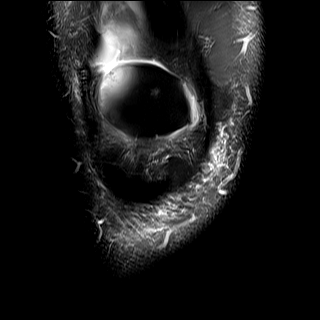
[im 11/28]
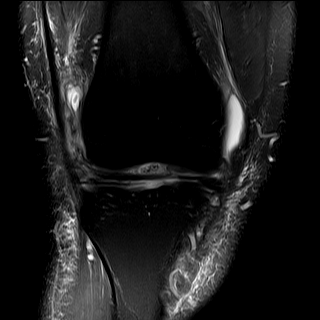
[im 17/28]
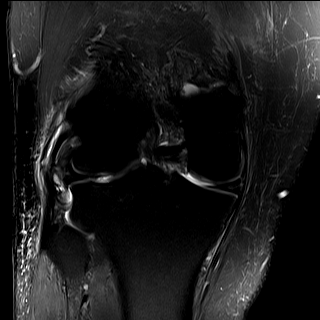
[im 22/28]
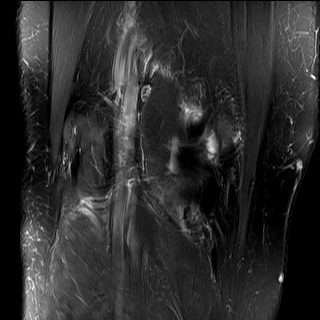
[im 28/28]
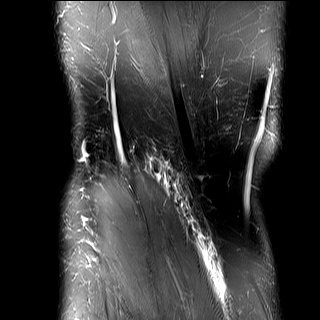

[Series 6: PD fat-sat · coronal · right · 3.0mm · 0.50mm/px · 8 of 36 slices shown (1 of 2)]
[im 1/36]
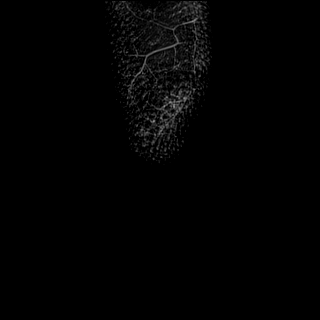
[im 6/36]
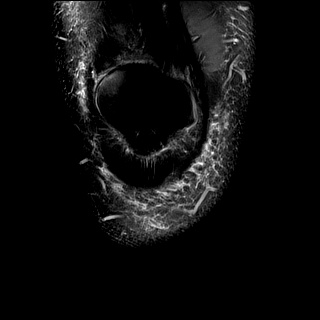
[im 11/36]
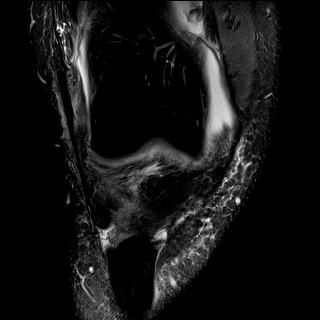
[im 16/36]
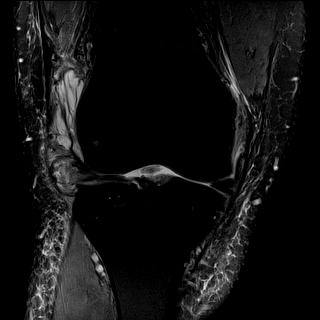
[im 21/36]
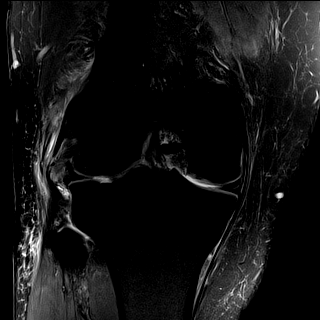
[im 26/36]
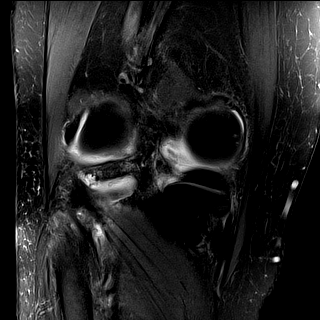
[im 31/36]
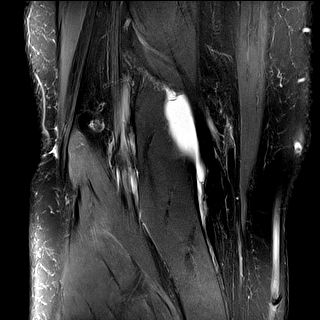
[im 36/36]
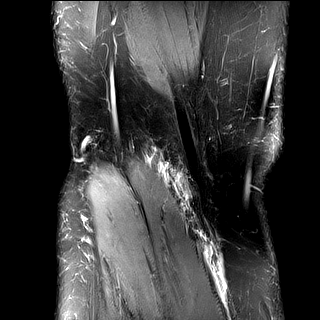

[Series 7: PD fat-sat · sagittal · right · 3.0mm · 0.47mm/px · 7 of 32 slices shown (2 of 2)]
[im 1/32]
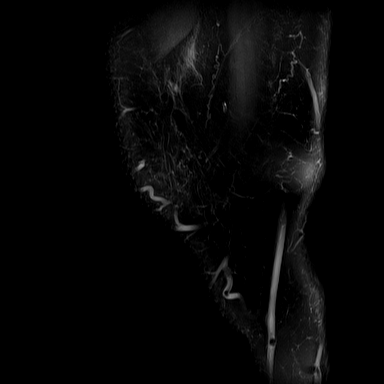
[im 6/32]
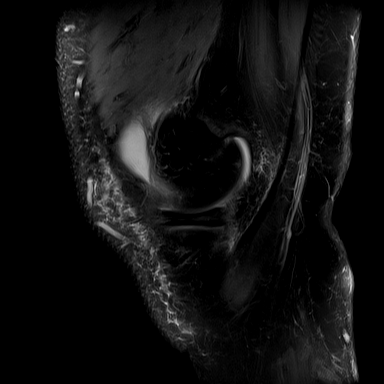
[im 11/32]
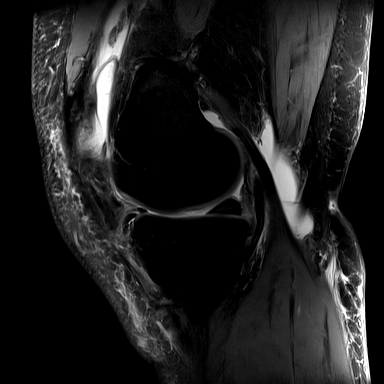
[im 16/32]
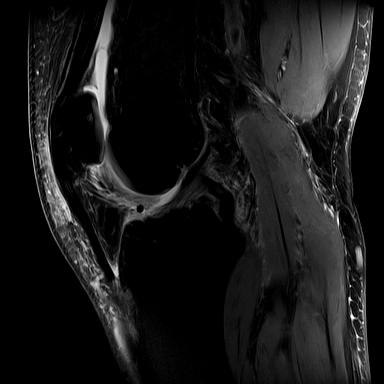
[im 21/32]
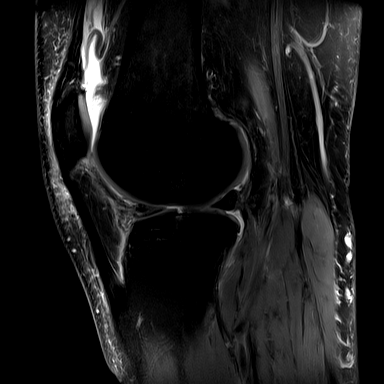
[im 26/32]
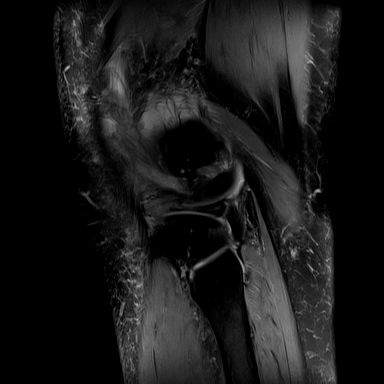
[im 32/32]
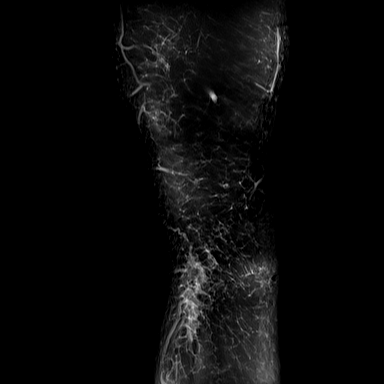

[Series 8: T2 fat-sat · sagittal · right · 3.0mm · 0.47mm/px · 7 of 32 slices shown (3 of 3)]
[im 1/32]
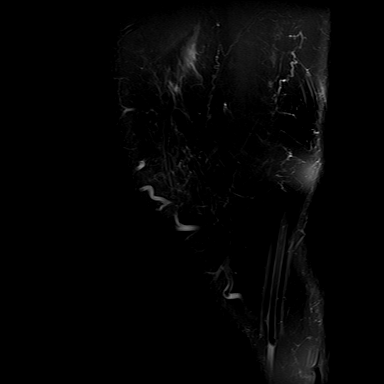
[im 6/32]
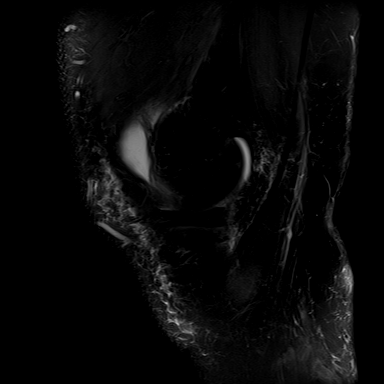
[im 11/32]
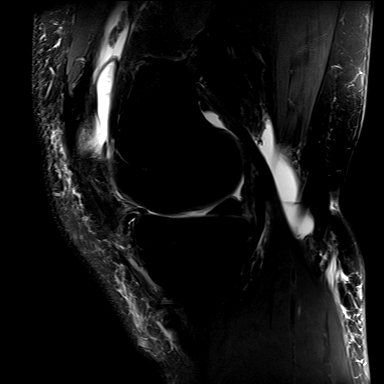
[im 16/32]
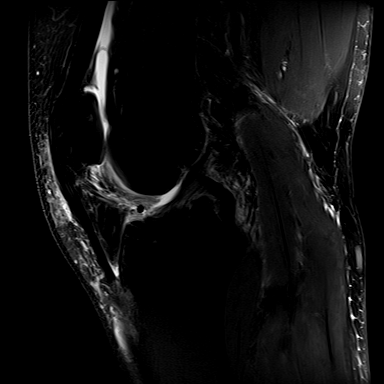
[im 21/32]
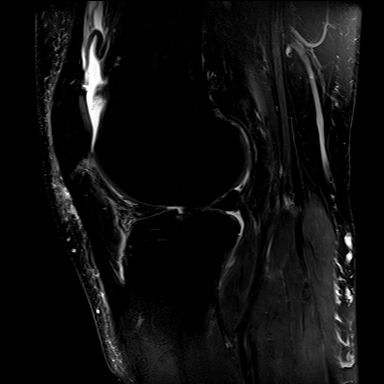
[im 26/32]
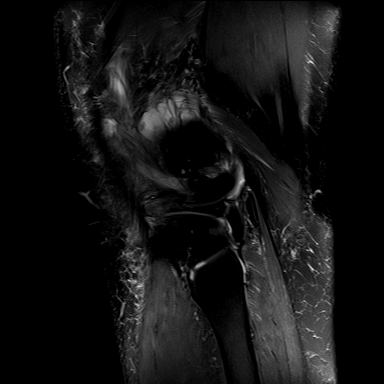
[im 32/32]
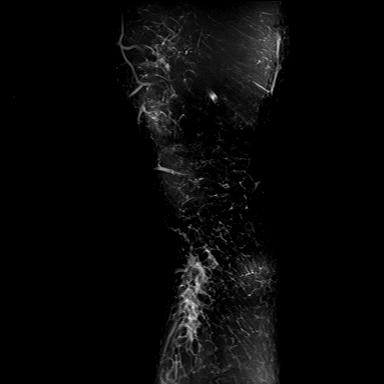

[39 of 40 positions shown; findings below may reference images not displayed]

FINDINGS: MENISCI

Medial: There is intrasubstance degenerative signal which does not
reach the articular surface. No definitive medial meniscus tear.

Lateral: Complex degenerative tearing of the anterior horn and body
of the lateral meniscus, similar to prior exam.

LIGAMENTS

Cruciates: ACL and PCL are intact.

Collaterals: Medial collateral ligament is intact. There is moderate
to severe tendinosis of the proximal popliteus tendon near its
origin on the femur. The lateral collateral ligament is intact.
Biceps femoris is intact.

CARTILAGE

Patellofemoral: Unchanged mild partial-thickness cartilage loss with
focal intermediate grade chondral fissure along the median patellar
ridge.

Medial: Progressive, low to intermediate grade partial-thickness
cartilage loss along the weight-bearing surfaces of the medial
compartment.

Lateral: Progressive cartilage loss in lateral compartment with
areas of intermediate high-grade cartilage loss, and focal
full-thickness defects along the weight-bearing lateral femoral
condyle measuring 5 x 2 mm and the tibial plateau measuring 8 x 7
mm. There is adjacent underlying marrow edema in the lateral tibial
plateau and lateral femoral condyle.

JOINT: Moderate-sized joint effusion.

POPLITEAL FOSSA: Large Baker's cyst.

EXTENSOR MECHANISM: Intact quadriceps tendon. Intact patellar
tendon.

BONES: There is a complex ganglion cyst or parameniscal cyst
extending along the lateral aspect of the joint, measuring 2.8 x
x 6.3 cm, previously 1.2 x 1.6 x 4.5 cm. Tricompartment osteophyte
formation.

Other: Mild prepatellar soft tissue swelling.
IMPRESSION: Complex degenerative tearing of the anterior horn and body of the
lateral meniscus.

Progressive Tricompartment osteoarthritis, worst in the lateral
compartment, cartilaginous abnormalities as described above.

Complex ganglion cyst or parameniscal cyst extending along the
lateral aspect of the joint measuring 2.8 x 1.4 x 6.3 cm, previously
1.2 x 1.6 x 4.5 cm.

Moderate-sized joint effusion.  Large Baker's cyst.

## 2024-01-13 ENCOUNTER — Other Ambulatory Visit: Payer: Self-pay | Admitting: Urology

## 2024-01-30 ENCOUNTER — Ambulatory Visit: Payer: Self-pay | Admitting: Anesthesiology

## 2024-01-30 ENCOUNTER — Encounter: Payer: Self-pay | Admitting: Gastroenterology

## 2024-01-30 ENCOUNTER — Other Ambulatory Visit: Payer: Self-pay

## 2024-01-30 ENCOUNTER — Ambulatory Visit
Admission: RE | Admit: 2024-01-30 | Discharge: 2024-01-30 | Disposition: A | Discharge status: Home / Self Care | Payer: Self-pay | Attending: Gastroenterology | Admitting: Gastroenterology

## 2024-01-30 ENCOUNTER — Encounter
Admission: RE | Disposition: A | Discharge status: Home / Self Care | Payer: Self-pay | Source: Home / Self Care | Attending: Gastroenterology

## 2024-01-30 DIAGNOSIS — Z79890 Hormone replacement therapy (postmenopausal): Secondary | ICD-10-CM | POA: Diagnosis not present

## 2024-01-30 DIAGNOSIS — Z79899 Other long term (current) drug therapy: Secondary | ICD-10-CM | POA: Insufficient documentation

## 2024-01-30 DIAGNOSIS — Z1211 Encounter for screening for malignant neoplasm of colon: Secondary | ICD-10-CM | POA: Diagnosis present

## 2024-01-30 DIAGNOSIS — E039 Hypothyroidism, unspecified: Secondary | ICD-10-CM | POA: Insufficient documentation

## 2024-01-30 DIAGNOSIS — F17210 Nicotine dependence, cigarettes, uncomplicated: Secondary | ICD-10-CM | POA: Diagnosis not present

## 2024-01-30 HISTORY — PX: COLONOSCOPY: SHX5424

## 2024-01-30 SURGERY — COLONOSCOPY
Anesthesia: General

## 2024-01-30 MED ORDER — STERILE WATER FOR IRRIGATION IR SOLN
Status: DC | PRN
  Administered 2024-01-30: 500 mL

## 2024-01-30 MED ORDER — LACTATED RINGERS IV SOLN: INTRAVENOUS | Status: DC | PRN

## 2024-01-30 MED ORDER — PROPOFOL 10 MG/ML IV BOLUS
INTRAVENOUS | Status: DC | PRN
  Administered 2024-01-30 (×3): 25 mg via INTRAVENOUS
  Administered 2024-01-30 (×2): 50 mg via INTRAVENOUS
  Administered 2024-01-30: 25 mg via INTRAVENOUS

## 2024-01-30 MED ORDER — EPHEDRINE SULFATE (PRESSORS) 50 MG/ML IJ SOLN
INTRAMUSCULAR | Status: DC | PRN
  Administered 2024-01-30 (×3): 5 mg via INTRAVENOUS

## 2024-01-30 MED ORDER — EPHEDRINE 5 MG/ML INJ
INTRAVENOUS | Status: AC
  Filled 2024-01-30: qty 5

## 2024-01-30 MED ORDER — PROPOFOL 1000 MG/100ML IV EMUL
INTRAVENOUS | Status: AC
  Filled 2024-01-30: qty 100

## 2024-01-30 SURGICAL SUPPLY — 15 items
CLIP HMST 235XBRD CATH ROT (MISCELLANEOUS) IMPLANT
ELECTRODE REM PT RTRN 9FT ADLT (ELECTROSURGICAL) IMPLANT
FORCEPS BIOP RAD 4 LRG CAP 4 (CUTTING FORCEPS) IMPLANT
FORCEPS ESCP3.2XJMB 240X2.8X (MISCELLANEOUS) IMPLANT
GAUZE SPONGE 4X4 12PLY STRL (GAUZE/BANDAGES/DRESSINGS) IMPLANT
GOWN CVR UNV OPN BCK APRN NK (MISCELLANEOUS) ×2 IMPLANT
INJECTOR VARIJECT VIN23 (MISCELLANEOUS) IMPLANT
KIT DEFENDO VALVE AND CONN (KITS) IMPLANT
KIT PRC NS LF DISP ENDO (KITS) ×1 IMPLANT
MANIFOLD NEPTUNE II (INSTRUMENTS) ×1 IMPLANT
MARKER SPOT ENDO TATTOO 5ML (MISCELLANEOUS) IMPLANT
PROBE APC STR FIRE (PROBE) IMPLANT
RETRIEVER NET ROTH 2.5X230 LF (MISCELLANEOUS) IMPLANT
SNARE COLD EXACTO (MISCELLANEOUS) IMPLANT
TRAP ETRAP POLY (MISCELLANEOUS) IMPLANT

## 2024-01-30 NOTE — Anesthesia Postprocedure Evaluation (Signed)
 Anesthesia Post Note  Patient: [name]  Procedure(s) Performed: COLONOSCOPY  Patient location during evaluation: PACU Anesthesia Type: General Level of consciousness: awake and alert Pain management: pain level controlled Vital Signs Assessment: post-procedure vital signs reviewed and stable Respiratory status: spontaneous breathing, nonlabored ventilation, respiratory function stable and patient connected to nasal cannula oxygen Cardiovascular status: blood pressure returned to baseline and stable Postop Assessment: no apparent nausea or vomiting Anesthetic complications: no   No notable events documented.   Last Vitals:  Vitals:   01/30/24 0814 01/30/24 0823  BP:  (!) 109/52  Pulse: 63 67  Resp: 18 18  Temp: 36.7 C 36.7 C  SpO2: 98% 98%    Last Pain:  Vitals:   01/30/24 0823  TempSrc:   PainSc: 0-No pain                 [name]

## 2024-01-30 NOTE — Transfer of Care (Signed)
 Immediate Anesthesia Transfer of Care Note  Patient: [name]  Procedure(s) Performed: COLONOSCOPY  Patient Location: PACU  Anesthesia Type: General  Level of Consciousness: awake, alert  and patient cooperative  Airway and Oxygen Therapy: Patient Spontanous Breathing and Patient connected to supplemental oxygen  Post-op Assessment: Post-op Vital signs reviewed, Patient's Cardiovascular Status Stable, Respiratory Function Stable, Patent Airway and No signs of Nausea or vomiting  Post-op Vital Signs: Reviewed and stable  Complications: No notable events documented.

## 2024-01-30 NOTE — H&P (Signed)
 Corinn JONELLE Brooklyn, MD [facility], DHIP [address]  [place], [place] [address]  Main: [phone] Fax:  [phone] Pager: [phone]   Primary Care Physician:  [facility] Primary Gastroenterologist:  Dr. Corinn JONELLE Brooklyn  Pre-Procedure History & Physical: HPI:  [name] is a 68 y.o. male is here for an colonoscopy.   Past Medical History:  Diagnosis Date   Arthritis    ED (erectile dysfunction)    Hypothyroidism    Thyroid disease     Past Surgical History:  Procedure Laterality Date   BACK SURGERY  1995, 2008   COLONOSCOPY  2015   FOOT FRACTURE SURGERY Right 2008   HEMORRHOID SURGERY  06-30-15   incision thrombosed hemorrhoid Dr Dellie   KNEE ARTHROSCOPY Right 12/03/2021   Procedure: Right knee arthroscopy, partial lateral meniscectomy, subchondroplasty of the lateral femoral condyle and lateral tibial plateau, and open lateral parameniscal cyst excision;  Surgeon: Tobie Priest, MD;  Location: [facility];  Service: Orthopedics;  Laterality: Right;    Prior to Admission medications   Medication Sig Start Date End Date Taking? Authorizing Provider  Cholecalciferol (VITAMIN D3) 50 MCG (2000 UT) TABS Take by mouth daily.   Yes [provider]  levothyroxine (SYNTHROID) 150 MCG tablet Take 150 mcg by mouth every morning.   Yes [provider]  Multiple Vitamin (MULTI-VITAMINS) TABS Take by mouth.   Yes [provider]  tamsulosin  (FLOMAX ) 0.4 MG CAPS capsule Take 2 capsules by mouth once daily 01/15/24  Yes Stoioff, Scott C, MD  vitamin B-12 (CYANOCOBALAMIN) 1000 MCG tablet Take 1,000 mcg by mouth daily.   Yes [provider]  acetaminophen  (TYLENOL ) 500 MG tablet Take 1,000 mg by mouth every 6 (six) hours as needed.    [provider]  amoxicillin (AMOXIL) 500 MG tablet TAKE 4 TABLETS BY MOUTH 1 HOUR BEFORE DENTAL APPOINTMENT Patient not taking: Reported on 01/30/2024 07/07/22    [provider]  tadalafil  (CIALIS ) 20 MG tablet Take 1 tablet (20 mg total) by mouth daily as needed for erectile dysfunction. 02/22/23   Twylla Glendia BROCKS, MD    Allergies as of 01/17/2024 - Review Complete 02/22/2023  Allergen Reaction Noted   Daypro [oxaprozin] Hives and Itching 06/30/2015    Family History  Problem Relation Age of Onset   Prostate cancer Neg Hx    Kidney cancer Neg Hx    Bladder Cancer Neg Hx     Social History   Socioeconomic History   Marital status: Legally Separated    Spouse name: Not on file   Number of children: Not on file   Years of education: Not on file   Highest education level: Not on file  Occupational History   Not on file  Tobacco Use   Smoking status: Every Day    Current packs/day: 0.75    Average packs/day: 0.8 packs/day for 30.0 years (22.5 ttl pk-yrs)    Types: Cigarettes   Smokeless tobacco: Never  Vaping Use   Vaping status: Never Used  Substance and Sexual Activity   Alcohol use: Yes    Alcohol/week: 0.0 standard drinks of alcohol    Comment: occasionally   Drug use: No   Sexual activity: Yes  Other Topics Concern   Not on file  Social History Narrative   Not on file   Social Drivers of Health   Financial Resource Strain: Low Risk  (12/04/2023)   Received from [facility]  Overall Financial Resource Strain (CARDIA)    Difficulty of Paying Living Expenses: Not hard at all  Food Insecurity: No Food Insecurity (12/04/2023)   Received from [facility]   Hunger Vital Sign    Within the past 12 months, you worried that your food would run out before you got the money to buy more.: Never true    Within the past 12 months, the food you bought just didn't last and you didn't have money to get more.: Never true  Transportation Needs: No Transportation Needs (12/04/2023)   Received from [facility] - Transportation    In the past 12 months, has lack of  transportation kept you from medical appointments or from getting medications?: No    Lack of Transportation (Non-Medical): No  Physical Activity: Inactive (12/04/2023)   Received from [facility]   Exercise Vital Sign    On average, how many days per week do you engage in moderate to strenuous exercise (like a brisk walk)?: 0 days    On average, how many minutes do you engage in exercise at this level?: 0 min  Stress: No Stress Concern Present (12/04/2023)   Received from [facility] - Occupational Stress Questionnaire    Feeling of Stress : Not at all  Social Connections: Moderately Integrated (12/04/2023)   Received from [facility]   Social Connection and Isolation Panel    In a typical week, how many times do you talk on the phone with family, friends, or neighbors?: Three times a week    How often do you get together with friends or relatives?: Once a week    How often do you attend church or religious services?: More than 4 times per year    Do you belong to any clubs or organizations such as church groups, unions, fraternal or athletic groups, or school groups?: No    How often do you attend meetings of the clubs or organizations you belong to?: Never    Are you married, widowed, divorced, separated, never married, or living with a partner?: Married  Intimate Partner Violence: Not on file    Review of Systems: See HPI, otherwise negative ROS  Physical Exam: BP 138/69   Temp 98.2 F (36.8 C) (Temporal)   Resp (!) 21   Ht 5' 10.98 (1.803 m)   Wt 118.8 kg   SpO2 94%   BMI 36.53 kg/m  General:   Alert,  pleasant and cooperative in NAD Head:  Normocephalic and atraumatic. Neck:  Supple; no masses or thyromegaly. Lungs:  Clear throughout to auscultation.    Heart:  Regular rate and rhythm. Abdomen:  Soft, nontender and nondistended. Normal bowel sounds, without guarding, and  without rebound.   Neurologic:  Alert and  oriented x4;  grossly normal neurologically.  Impression/Plan: CHANNIN AGUSTIN is here for an colonoscopy to be performed for colon cancer screening  Risks, benefits, limitations, and alternatives regarding  colonoscopy have been reviewed with the patient.  Questions have been answered.  All parties agreeable.   Corinn Brooklyn, MD  01/30/2024, 7:38 AM

## 2024-01-30 NOTE — Anesthesia Preprocedure Evaluation (Signed)
 Anesthesia Evaluation  Patient identified by MRN, date of birth, ID band Patient awake    Reviewed: Allergy & Precautions, H&P , NPO status , Patient's Chart, lab work & pertinent test results, reviewed documented beta blocker date and time   Airway Mallampati: II   Neck ROM: full    Dental  (+) Poor Dentition   Pulmonary neg pulmonary ROS, Current Smoker   Pulmonary exam normal        Cardiovascular Exercise Tolerance: Good negative cardio ROS Normal cardiovascular exam Rhythm:regular Rate:Normal     Neuro/Psych negative neurological ROS  negative psych ROS   GI/Hepatic negative GI ROS, Neg liver ROS,,,  Endo/Other  Hypothyroidism    Renal/GU negative Renal ROS  negative genitourinary   Musculoskeletal   Abdominal   Peds  Hematology negative hematology ROS (+)   Anesthesia Other Findings Past Medical History: No date: Arthritis No date: ED (erectile dysfunction) No date: Hypothyroidism No date: Thyroid disease Past Surgical History: 1995, 2008: BACK SURGERY 2015: COLONOSCOPY 2008: FOOT FRACTURE SURGERY; Right 06-30-15: HEMORRHOID SURGERY     Comment:  incision thrombosed hemorrhoid Dr Dellie 12/03/2021: KNEE ARTHROSCOPY; Right     Comment:  Procedure: Right knee arthroscopy, partial lateral               meniscectomy, subchondroplasty of the lateral femoral               condyle and lateral tibial plateau, and open lateral               parameniscal cyst excision;  Surgeon: Tobie Priest, MD;                Location: [facility];  Service: Orthopedics;                Laterality: Right; BMI    Body Mass Index: 36.26 kg/m     Reproductive/Obstetrics negative OB ROS                              Anesthesia Physical Anesthesia Plan  ASA: 2  Anesthesia Plan: General   Post-op Pain Management:    Induction:   PONV Risk Score and Plan: 1  Airway Management Planned:    Additional Equipment:   Intra-op Plan:   Post-operative Plan:   Informed Consent: I have reviewed the patients History and Physical, chart, labs and discussed the procedure including the risks, benefits and alternatives for the proposed anesthesia with the patient or authorized representative who has indicated his/her understanding and acceptance.     Dental Advisory Given  Plan Discussed with: CRNA  Anesthesia Plan Comments:         Anesthesia Quick Evaluation

## 2024-01-30 NOTE — Op Note (Signed)
 [facility]astroenterology Patient Name: [name] Procedure Date: 01/30/2024 7:18 AM MRN: [id number] Account #: [id number] Date of Birth: 10-05-1955 Admit Type: Outpatient Age: 68 Room: [facility] OR ROOM 01 Gender: Male Note Status: Finalized Instrument Name: Colonoscope [id number] Procedure:             Colonoscopy Indications:           Screening for colorectal malignant neoplasm, Last                         colonoscopy: September 2015, Last colonoscopy 10 years                         ago Providers:             Corinn Jess Brooklyn MD, MD Referring MD:          [facility] (Referring MD) Medicines:             General Anesthesia Complications:         No immediate complications. Estimated blood loss: None. Procedure:             Pre-Anesthesia Assessment:                        - Prior to the procedure, a History and Physical was                         performed, and patient medications and allergies were                         reviewed. The patient is competent. The risks and                         benefits of the procedure and the sedation options and                         risks were discussed with the patient. All questions                         were answered and informed consent was obtained.                         Patient identification and proposed procedure were                         verified by the physician, the nurse, the                         anesthesiologist, the anesthetist and the technician                         in the pre-procedure area in the procedure room in the                         endoscopy suite. Mental Status Examination: alert and                         oriented. Airway Examination: normal oropharyngeal  airway and neck mobility. Respiratory Examination:                         clear to auscultation. CV Examination: normal.                         Prophylactic Antibiotics: The patient does  not require                         prophylactic antibiotics. Prior Anticoagulants: The                         patient has taken no anticoagulant or antiplatelet                         agents. ASA Grade Assessment: II - A patient with mild                         systemic disease. After reviewing the risks and                         benefits, the patient was deemed in satisfactory                         condition to undergo the procedure. The anesthesia                         plan was to use general anesthesia. Immediately prior                         to administration of medications, the patient was                         re-assessed for adequacy to receive sedatives. The                         heart rate, respiratory rate, oxygen saturations,                         blood pressure, adequacy of pulmonary ventilation, and                         response to care were monitored throughout the                         procedure. The physical status of the patient was                         re-assessed after the procedure.                        After obtaining informed consent, the colonoscope was                         passed under direct vision. Throughout the procedure,                         the patient's blood pressure, pulse, and oxygen  saturations were monitored continuously. The                         Colonoscope was introduced through the anus and                         advanced to the the cecum, identified by appendiceal                         orifice and ileocecal valve. The colonoscopy was                         performed without difficulty. The patient tolerated                         the procedure well. The quality of the bowel                         preparation was evaluated using the BBPS [facility] Bowel                         Preparation Scale) with scores of: Right Colon = 3,                         Transverse Colon = 3 and Left Colon =  3 (entire mucosa                         seen well with no residual staining, small fragments                         of stool or opaque liquid). The total BBPS score                         equals 9. The ileocecal valve, appendiceal orifice,                         and rectum were photographed. Findings:      The perianal and digital rectal examinations were normal. Pertinent       negatives include normal sphincter tone and no palpable rectal lesions.      The entire examined colon appeared normal.      The retroflexed view of the distal rectum and anal verge was normal and       showed no anal or rectal abnormalities. Impression:            - The entire examined colon is normal.                        - The distal rectum and anal verge are normal on                         retroflexion view.                        - No specimens collected. Recommendation:        - Discharge patient to home (with spouse).                        -  Resume previous diet today.                        - Continue present medications.                        - Repeat colonoscopy in 10 years for screening                         purposes. Procedure Code(s):     --- Professional ---                        H9878, Colorectal cancer screening; colonoscopy on                         individual not meeting criteria for high risk Diagnosis Code(s):     --- Professional ---                        Z12.11, Encounter for screening for malignant neoplasm                         of colon CPT copyright 2022 [employer]. All rights reserved. The codes documented in this report are preliminary and upon coder review may  be revised to meet current compliance requirements. Dr. Corinn Brooklyn Corinn Jess Brooklyn MD, MD 01/30/2024 8:13:04 AM This report has been signed electronically. Number of Addenda: 0 Note Initiated On: 01/30/2024 7:18 AM Scope Withdrawal Time: 0 hours 8 minutes 13 seconds  Total Procedure  Duration: 0 hours 15 minutes 36 seconds  Estimated Blood Loss:  Estimated blood loss: none.      [facility]

## 2024-02-22 ENCOUNTER — Ambulatory Visit: Payer: 59 | Admitting: Urology

## 2024-02-23 ENCOUNTER — Ambulatory Visit: Payer: Self-pay | Admitting: Urology

## 2024-04-07 ENCOUNTER — Other Ambulatory Visit: Payer: Self-pay | Admitting: Urology

## 2024-05-26 ENCOUNTER — Other Ambulatory Visit: Payer: Self-pay | Admitting: Urology

## 2024-06-11 ENCOUNTER — Ambulatory Visit: Admitting: Physician Assistant

## 2024-06-11 ENCOUNTER — Encounter: Payer: Self-pay | Admitting: Physician Assistant

## 2024-06-11 VITALS — BP 114/72 | HR 77 | Ht 70.0 in | Wt 287.1 lb

## 2024-06-11 DIAGNOSIS — R350 Frequency of micturition: Secondary | ICD-10-CM

## 2024-06-11 DIAGNOSIS — N5201 Erectile dysfunction due to arterial insufficiency: Secondary | ICD-10-CM

## 2024-06-11 DIAGNOSIS — N401 Enlarged prostate with lower urinary tract symptoms: Secondary | ICD-10-CM | POA: Diagnosis not present

## 2024-06-11 LAB — BLADDER SCAN AMB NON-IMAGING: Scan Result: 0 mL

## 2024-06-11 MED ORDER — OXYBUTYNIN CHLORIDE ER 10 MG PO TB24: 10.0000 mg | ORAL_TABLET | Freq: Every day | ORAL | 2 refills | Status: AC

## 2024-06-11 MED ORDER — TAMSULOSIN HCL 0.4 MG PO CAPS: 0.8000 mg | ORAL_CAPSULE | Freq: Every day | ORAL | 3 refills | Status: AC

## 2024-06-11 NOTE — Progress Notes (Addendum)
 "  06/11/2024 2:13 PM   [name] Sep 05, 1955 [id number]  CC: Chief Complaint  Patient presents with   Follow-up    Medication refill   HPI: [name] is a 69 y.o. male with PMH BPH with LUTS on Flomax  0.8 mg daily and ED on demand dose tadalafil  20 mg who presents today for routine follow-up.   Today he reports adequate symptom control on daily Flomax .  He still has some occasional bothersome urgency every 60 to 90 minutes, frequency, and urge incontinence.  With regard to his erections, he takes the tadalafil  only occasionally.  He can sometimes achieve a satisfactory erection without medication.  IPSS 15/mixed as below.  SHIM 19.  PVR 0 mL.  PSA low and stable at 0.74 6 months ago.   IPSS     Row Name 06/11/24 1100         International Prostate Symptom Score   How often have you had the sensation of not emptying your bladder? About half the time     How often have you had to urinate less than every two hours? About half the time     How often have you found you stopped and started again several times when you urinated? Less than half the time     How often have you found it difficult to postpone urination? About half the time     How often have you had a weak urinary stream? Less than 1 in 5 times     How often have you had to strain to start urination? Less than 1 in 5 times     How many times did you typically get up at night to urinate? 2 Times     Total IPSS Score 15       Quality of Life due to urinary symptoms   If you were to spend the rest of your life with your urinary condition just the way it is now how would you feel about that? Mixed         SHIM     Row Name 06/11/24 1136         SHIM: Over the last 6 months:   How do you rate your confidence that you could get and keep an erection? Moderate     When you had erections with sexual stimulation, how often were your erections hard enough for penetration (entering your partner)? Sometimes (about  half the time)     During sexual intercourse, how often were you able to maintain your erection after you had penetrated (entered) your partner? Most Times (much more than half the time)     During sexual intercourse, how difficult was it to maintain your erection to completion of intercourse? Slightly Difficult     When you attempted sexual intercourse, how often was it satisfactory for you? Almost Always or Always       SHIM Total Score   SHIM 19        PMH: Past Medical History:  Diagnosis Date   Arthritis    ED (erectile dysfunction)    Hypothyroidism    Thyroid disease     Surgical History: Past Surgical History:  Procedure Laterality Date   BACK SURGERY  1995, 2008   COLONOSCOPY  2015   COLONOSCOPY N/A 01/30/2024   Procedure: COLONOSCOPY;  Surgeon: Unk Corinn Skiff, MD;  Location: [facility];  Service: Endoscopy;  Laterality: N/A;   FOOT FRACTURE SURGERY Right 2008   HEMORRHOID  SURGERY  06-30-15   incision thrombosed hemorrhoid Dr Dellie   KNEE ARTHROSCOPY Right 12/03/2021   Procedure: Right knee arthroscopy, partial lateral meniscectomy, subchondroplasty of the lateral femoral condyle and lateral tibial plateau, and open lateral parameniscal cyst excision;  Surgeon: Tobie Priest, MD;  Location: [facility];  Service: Orthopedics;  Laterality: Right;    Home Medications:  Allergies as of 06/11/2024       Reactions   Daypro [oxaprozin] Hives, Itching        Medication List        Accurate as of June 11, 2024  2:13 PM. If you have any questions, ask your nurse or doctor.          STOP taking these medications    solifenacin 5 MG tablet Commonly known as: VESICARE Stopped by: Lucie Hones       TAKE these medications    acetaminophen  500 MG tablet Commonly known as: TYLENOL  Take 1,000 mg by mouth every 6 (six) hours as needed.   aspirin  EC 81 MG tablet TAKE 1 TABLET BY MOUTH TWICE DAILY FOR 6 WEEKS FOR POST-OPERATIVE  BLOOD CLOT PREVENETION (DO NOT TAKE PRIOR TO SURGERY)   cefadroxil 500 MG capsule Commonly known as: DURICEF Take 500 mg by mouth 2 (two) times daily.   celecoxib 200 MG capsule Commonly known as: CELEBREX TAKE 1 CAPSULE BY MOUTH TWICE DAILY FOR POST-OPERATIVE PAIN   cyanocobalamin 1000 MCG tablet Commonly known as: VITAMIN B12 Take 1,000 mcg by mouth daily.   gabapentin 100 MG capsule Commonly known as: NEURONTIN Take 100 mg by mouth 3 (three) times daily.   HYDROcodone -acetaminophen  5-325 MG tablet Commonly known as: NORCO/VICODIN TAKE 1 TO 2 TABLETS BY MOUTH EVERY 4 HOURS AS NEEDED FOR MODERATE TO SEVERE PAIN   ibuprofen  800 MG tablet Commonly known as: ADVIL  TAKE 1 TABLET BY MOUTH THREE TIMES DAILY FOR 14 DAYS   levothyroxine 150 MCG tablet Commonly known as: SYNTHROID Take 150 mcg by mouth every morning.   meloxicam 15 MG tablet Commonly known as: MOBIC Take 1 tablet by mouth daily.   Multi-Vitamins Tabs Take by mouth.   Na Sulfate-K Sulfate-Mg Sulfate concentrate 17.5-3.13-1.6 GM/177ML Soln Commonly known as: SUPREP Take 1 Bottle by mouth.   ondansetron  4 MG disintegrating tablet Commonly known as: ZOFRAN -ODT DISSOLVE 1 TABLET IN MOUTH EVERY 4 TO 6 HOURS AS NEEDED FOR POST-OPERATIVE NAUSEA   oxybutynin  10 MG 24 hr tablet Commonly known as: DITROPAN -XL Take 1 tablet (10 mg total) by mouth daily. Started by: Baylen Buckner   oxyCODONE  5 MG immediate release tablet Commonly known as: Oxy IR/ROXICODONE  Take 1 tablet every 4 hours as needed for post-operative pain (Do not take prior to surgery)   senna-docusate 8.6-50 MG tablet Commonly known as: Senokot-S take 2 daily while on pain medicine after surgery   tadalafil  20 MG tablet Commonly known as: CIALIS  Take 1 tablet (20 mg total) by mouth daily as needed for erectile dysfunction.   tamsulosin  0.4 MG Caps capsule Commonly known as: FLOMAX  Take 2 capsules (0.8 mg total) by mouth daily.    varenicline 1 MG tablet Commonly known as: CHANTIX Take 1 tablet by mouth 2 (two) times daily.   Vitamin D3 50 MCG (2000 UT) Tabs Take by mouth daily.        Allergies:  Allergies[1]  Family History: Family History  Problem Relation Age of Onset   Prostate cancer Neg Hx    Kidney cancer Neg Hx    Bladder  Cancer Neg Hx     Social History:   reports that he has been smoking cigarettes. He has a 22.5 pack-year smoking history. He has never used smokeless tobacco. He reports current alcohol use. He reports that he does not use drugs.  Physical Exam: BP 114/72   Pulse 77   Ht 5' 10 (1.778 m)   Wt 287 lb 1.6 oz (130.2 kg)   BMI 41.19 kg/m   Constitutional:  Alert and oriented, no acute distress, nontoxic appearing HEENT: [place], AT Cardiovascular: No clubbing, cyanosis, or edema Respiratory: Normal respiratory effort, no increased work of breathing Skin: No rashes, bruises or suspicious lesions Neurologic: Grossly intact, no focal deficits, moving all 4 extremities Psychiatric: Normal mood and affect  Laboratory Data: Results for orders placed or performed in visit on 06/11/24  Bladder Scan (Post Void Residual) in office   Collection Time: 06/11/24 11:39 AM  Result Value Ref Range   Scan Result 0 ml   Assessment & Plan:   1. Benign prostatic hyperplasia with urinary frequency (Primary) Emptying appropriately on Flomax  0.8 mg daily.  He is having some bothersome storage related symptoms so I recommended augmenting with oxybutynin .  Will see him back for symptom recheck and PVR in 6 weeks. - Bladder Scan (Post Void Residual) in office - tamsulosin  (FLOMAX ) 0.4 MG CAPS capsule; Take 2 capsules (0.8 mg total) by mouth daily.  Dispense: 180 capsule; Refill: 3 - oxybutynin  (DITROPAN -XL) 10 MG 24 hr tablet; Take 1 tablet (10 mg total) by mouth daily.  Dispense: 30 tablet; Refill: 2  2. Erectile dysfunction due to arterial insufficiency Sometimes able to achieve satisfactory  erections without pharmacotherapy, otherwise tadalafil  20 mg demand dose is working well for him.  Will continue this.   Return in about 6 weeks (around 07/23/2024) for Symptom recheck with PVR.  Lucie Hones, PA-C  [facility] [place] [address] [place], [place] [address] [phone]      [1]  Allergies Allergen Reactions   Daypro [Oxaprozin] Hives and Itching   "

## 2024-07-23 ENCOUNTER — Ambulatory Visit: Admitting: Physician Assistant
